# Patient Record
Sex: Female | Born: 1970 | Race: White | Hispanic: No | Marital: Married | Smoking: Former smoker
Health system: Southern US, Community
[De-identification: ages and names within clinical notes are randomized; demographics above are authoritative.]

## PROBLEM LIST (undated history)

## (undated) DIAGNOSIS — Z8669 Personal history of other diseases of the nervous system and sense organs: Secondary | ICD-10-CM

## (undated) DIAGNOSIS — I10 Essential (primary) hypertension: Secondary | ICD-10-CM

## (undated) DIAGNOSIS — K8020 Calculus of gallbladder without cholecystitis without obstruction: Secondary | ICD-10-CM

## (undated) DIAGNOSIS — R350 Frequency of micturition: Secondary | ICD-10-CM

## (undated) DIAGNOSIS — R200 Anesthesia of skin: Secondary | ICD-10-CM

## (undated) DIAGNOSIS — Z9889 Other specified postprocedural states: Secondary | ICD-10-CM

## (undated) DIAGNOSIS — T884XXA Failed or difficult intubation, initial encounter: Secondary | ICD-10-CM

## (undated) DIAGNOSIS — G4700 Insomnia, unspecified: Secondary | ICD-10-CM

## (undated) DIAGNOSIS — K5909 Other constipation: Secondary | ICD-10-CM

## (undated) DIAGNOSIS — K529 Noninfective gastroenteritis and colitis, unspecified: Secondary | ICD-10-CM

## (undated) DIAGNOSIS — K649 Unspecified hemorrhoids: Secondary | ICD-10-CM

## (undated) DIAGNOSIS — R29898 Other symptoms and signs involving the musculoskeletal system: Secondary | ICD-10-CM

## (undated) HISTORY — PX: ESOPHAGOGASTRODUODENOSCOPY: SHX1529

## (undated) HISTORY — PX: OTHER SURGICAL HISTORY: SHX169

## (undated) HISTORY — DX: Other symptoms and signs involving the musculoskeletal system: R29.898

## (undated) HISTORY — DX: Essential (primary) hypertension: I10

## (undated) HISTORY — DX: Anesthesia of skin: R20.0

## (undated) HISTORY — DX: Calculus of gallbladder without cholecystitis without obstruction: K80.20

## (undated) HISTORY — PX: COLONOSCOPY: SHX174

## (undated) HISTORY — PX: INCONTINENCE SURGERY: SHX676

---

## 2011-03-24 DIAGNOSIS — F419 Anxiety disorder, unspecified: Secondary | ICD-10-CM | POA: Insufficient documentation

## 2011-03-24 DIAGNOSIS — G4700 Insomnia, unspecified: Secondary | ICD-10-CM | POA: Insufficient documentation

## 2011-03-24 DIAGNOSIS — K589 Irritable bowel syndrome without diarrhea: Secondary | ICD-10-CM | POA: Insufficient documentation

## 2012-02-20 ENCOUNTER — Encounter (INDEPENDENT_AMBULATORY_CARE_PROVIDER_SITE_OTHER): Payer: Self-pay | Admitting: General Surgery

## 2012-02-20 ENCOUNTER — Ambulatory Visit (INDEPENDENT_AMBULATORY_CARE_PROVIDER_SITE_OTHER): Payer: 59 | Admitting: General Surgery

## 2012-02-20 VITALS — BP 136/92 | HR 77 | Temp 98.4°F | Resp 18 | Ht 65.5 in | Wt 196.2 lb

## 2012-02-20 DIAGNOSIS — K8020 Calculus of gallbladder without cholecystitis without obstruction: Secondary | ICD-10-CM

## 2012-02-20 NOTE — Patient Instructions (Signed)

## 2012-02-20 NOTE — Progress Notes (Signed)
Patient ID: Danielle Gould, female   DOB: 1970/10/25, 41 y.o.   MRN: [id number]  Chief Complaint  Patient presents with  . Abdominal Pain    new pt- eval GB    HPI Danielle Gould is a 41 y.o. female.   HPI This is a 41 year old female referred by Dr. Isabella Stalling for possible cholecystectomy. The patient states that she had an episode of severe epigastric pain about a month and a half ago. It was associated with vomiting and sweating. She initially thought it was food poisoning. She awoke in the middle of the night continuing to have right upper quadrant pain radiating toward her back and right shoulder. She went to work the next day but had to leave early due to the persistent pain. She went to the emergency room at [facility]. An ultrasound was performed which revealed gallstones. She was discharged to home from the emergency room. I do not have a copy of her blood work from that ER visit. She states since that time she has continued to have intermittent attacks. They generally occur twice a week. This sharp pain lasts for about 15 minutes but then she will have generalized discomfort and soreness for several hours. She denies any weight loss, fever, chills, acholic stools, recent NSAID use, melena or hematochezia.  Past Medical History  Diagnosis Date  . Hypertension   . Gallstones     Past Surgical History  Procedure Date  . Incontinence surgery     History reviewed. No pertinent family history.  Social History History  Substance Use Topics  . Smoking status: Current Everyday Smoker -- 0.5 packs/day  . Smokeless tobacco: Not on file  . Alcohol Use: Yes    No Known Allergies  Current Outpatient Prescriptions  Medication Sig Dispense Refill  . clonazePAM (KLONOPIN) 0.5 MG tablet       . dicyclomine (BENTYL) 20 MG tablet       . Metoprolol-Hydrochlorothiazide 50-12.5 MG TB24       . ondansetron (ZOFRAN-ODT) 4 MG disintegrating tablet       . oxyCODONE-acetaminophen  (PERCOCET) 5-325 MG per tablet       . temazepam (RESTORIL) 15 MG capsule       . zolpidem (AMBIEN) 10 MG tablet         Review of Systems Review of Systems  Constitutional: Negative for fever, activity change, appetite change and unexpected weight change.  HENT: Negative for nosebleeds, congestion and neck pain.   Eyes: Negative for photophobia and visual disturbance.  Respiratory: Negative for chest tightness and shortness of breath.        Denies DOE, PND; sleeps with 2 pillows  Cardiovascular: Negative for chest pain and leg swelling.  Gastrointestinal:       See hpi  Genitourinary: Negative for dysuria, hematuria and difficulty urinating.  Musculoskeletal:       L shoulder pain  Skin: Negative for color change.  Neurological: Positive for headaches. Negative for tremors, seizures, weakness, light-headedness and numbness.       No TIAs, amaurosis fugax  Hematological: Negative for adenopathy.  Psychiatric/Behavioral: Negative for suicidal ideas and behavioral problems.    Blood pressure 136/92, pulse 77, temperature 98.4 F (36.9 C), temperature source Temporal, resp. rate 18, height 5' 5.5" (1.664 m), weight 196 lb 3.2 oz (88.996 kg).  Physical Exam Physical Exam  Vitals reviewed. Constitutional: She is oriented to person, place, and time. She appears well-developed and well-nourished. No distress.  overweight  HENT:  Head: Normocephalic and atraumatic.  Right Ear: External ear normal.  Left Ear: External ear normal.  Eyes: Conjunctivae are normal. No scleral icterus.  Neck: Normal range of motion. Neck supple. No tracheal deviation present. No thyromegaly present.  Cardiovascular: Normal rate, regular rhythm, normal heart sounds and intact distal pulses.   Pulmonary/Chest: Effort normal and breath sounds normal. No respiratory distress. She has no wheezes.  Abdominal: Soft. Bowel sounds are normal. She exhibits no distension. There is no tenderness. There is no  guarding.  Musculoskeletal: Normal range of motion. She exhibits no edema and no tenderness.  Lymphadenopathy:    She has no cervical adenopathy.  Neurological: She is alert and oriented to person, place, and time. She exhibits normal muscle tone.  Skin: Skin is warm and dry. She is not diaphoretic.  Psychiatric: She has a normal mood and affect. Her behavior is normal. Judgment and thought content normal.    Data Reviewed Dr Tomma Lightning' note from 02/04/12 U/S from [facility] 01/09/12: The liver is homogeneous in echotexture without evidence of focal mass. No biliary ductal dilatation is noted. Gallstones are noted without gallbladder wall thickening. No sonographic Murphy's sign is noted. The common bile duct is within normal limits measuring 3.8 mm. Otherwise abdominal ultrasound normal  Assessment    Symptomatic cholelithiasis    Plan    I believe the majority of the patient's symptoms are due to gallbladder disease.   We discussed gallbladder disease. The patient was given Agricultural engineer. We discussed non-operative and operative management. We discussed the signs & symptoms of acute cholecystitis.  I discussed laparoscopic cholecystectomy with IOC in detail.  The patient was given educational material as well as diagrams detailing the procedure.  We discussed the risks and benefits of a laparoscopic cholecystectomy including, but not limited to bleeding, infection, injury to surrounding structures such as the intestine or liver, bile leak, retained gallstones, need to convert to an open procedure, prolonged diarrhea, blood clots such as  DVT, common bile duct injury, anesthesia risks, and possible need for additional procedures.  We discussed the typical post-operative recovery course. I explained that the likelihood of improvement of their symptoms is good.  The patient was also strongly encouraged to get a routine screening mammogram as part of her preventative health  maintenance especially given her family history.  The patient will be scheduled for a LAPAROSCOPIC CHOLECYSTECTOMY WITH IOC  Mary Sella. Andrey Campanile, MD, FACS General, Bariatric, & Minimally Invasive Surgery [facility], [place]        [facility] M 02/20/2012, 6:25 PM

## 2012-03-22 ENCOUNTER — Encounter (HOSPITAL_COMMUNITY): Payer: Self-pay | Admitting: Respiratory Therapy

## 2012-03-25 ENCOUNTER — Other Ambulatory Visit: Payer: Self-pay

## 2012-03-25 ENCOUNTER — Encounter (HOSPITAL_COMMUNITY): Payer: Self-pay

## 2012-03-25 ENCOUNTER — Encounter (HOSPITAL_COMMUNITY)
Admission: RE | Admit: 2012-03-25 | Discharge: 2012-03-25 | Disposition: A | Discharge status: Home / Self Care | Payer: 59 | Source: Ambulatory Visit | Attending: General Surgery | Admitting: General Surgery

## 2012-03-25 HISTORY — DX: Other constipation: K59.09

## 2012-03-25 HISTORY — DX: Unspecified hemorrhoids: K64.9

## 2012-03-25 HISTORY — DX: Insomnia, unspecified: G47.00

## 2012-03-25 HISTORY — DX: Other specified postprocedural states: Z98.890

## 2012-03-25 HISTORY — DX: Frequency of micturition: R35.0

## 2012-03-25 HISTORY — DX: Personal history of other diseases of the nervous system and sense organs: Z86.69

## 2012-03-25 HISTORY — DX: Noninfective gastroenteritis and colitis, unspecified: K52.9

## 2012-03-25 LAB — HEPATIC FUNCTION PANEL
Albumin: 3.6 g/dL (ref 3.5–5.2)
Alkaline Phosphatase: 62 U/L (ref 39–117)
Total Protein: 7.4 g/dL (ref 6.0–8.3)

## 2012-03-25 LAB — CBC WITH DIFFERENTIAL/PLATELET
Basophils Absolute: 0.1 10*3/uL (ref 0.0–0.1)
Basophils Relative: 1 % (ref 0–1)
Eosinophils Absolute: 0.2 10*3/uL (ref 0.0–0.7)
Eosinophils Relative: 3 % (ref 0–5)
HCT: 42.9 % (ref 36.0–46.0)
MCH: 30.2 pg (ref 26.0–34.0)
MCHC: 34.5 g/dL (ref 30.0–36.0)
MCV: 87.6 fL (ref 78.0–100.0)
Monocytes Absolute: 0.4 10*3/uL (ref 0.1–1.0)
Monocytes Relative: 6 % (ref 3–12)
Neutro Abs: 4 10*3/uL (ref 1.7–7.7)
RDW: 13.2 % (ref 11.5–15.5)

## 2012-03-25 LAB — BASIC METABOLIC PANEL
BUN: 11 mg/dL (ref 6–23)
Chloride: 101 mEq/L (ref 96–112)
Creatinine, Ser: 0.83 mg/dL (ref 0.50–1.10)
GFR calc Af Amer: >90 mL/min (ref >90)
Glucose, Bld: 92 mg/dL (ref 70–99)

## 2012-03-25 MED ORDER — CHLORHEXIDINE GLUCONATE 4 % EX LIQD: 1.0000 "application " | Freq: Once | CUTANEOUS | Status: DC

## 2012-03-25 NOTE — Progress Notes (Signed)
Pt doesn't have a cardiologist  Stress test done 9+yrs ago(no issues but only done b/c of complete physical) Denies echo or heart cath  Medical MD is Dr.Kates on [place]  Denies recent ekg or cxr

## 2012-03-25 NOTE — Pre-Procedure Instructions (Signed)
20 Danielle Gould  03/25/2012   Your procedure is scheduled on:  Thurs, Aug 8 @ 7:30 AM  Report to [facility] at 5:30 AM.  Call this number if you have problems the morning of surgery: [phone]   Remember:   Do not eat food:After Midnight.    Take these medicines the morning of surgery with A SIP OF WATER: Metoprolol(Toprol) and Pain Pill(if needed)   Do not wear jewelry, make-up or nail polish.  Do not wear lotions, powders, or perfumes.   Do not shave 48 hours prior to surgery.  Do not bring valuables to the hospital.  Contacts, dentures or bridgework may not be worn into surgery.  Leave suitcase in the car. After surgery it may be brought to your room.  For patients admitted to the hospital, checkout time is 11:00 AM the day of discharge.   Patients discharged the day of surgery will not be allowed to drive home.    Special Instructions: CHG Shower Use Special Wash: 1/2 bottle night before surgery and 1/2 bottle morning of surgery.   Please read over the following fact sheets that you were given: Pain Booklet, Coughing and Deep Breathing, MRSA Information and Surgical Site Infection Prevention

## 2012-03-26 NOTE — Consult Note (Addendum)
Anesthesia Chart Review:  Patient is a 41 year old female scheduled for laparoscopic cholecystectomy on 04/01/12 by Dr. Andrey Campanile.  PCP is Dr. Tomma Lightning [id number]) on [place].  History includes smoking, HTN, obesity with BMI 32, uterine prolopse, transurethral destruction of bladder lesion, insomnia.  Chest x-ray on 03/25/2012 showed borderline cardiomegaly, but no active disease.  Labs acceptable.  EKG on 03/25/12 showed NSR with sinus arrhythmia, possible lateral infarct (age undetermined).  She has what appears to be small, non-diagnositic Q waves in lateral leads and an negative QRS in aVL.  Will see if we can get a prior EKG from her PCP office.  She reported a prior stress test but it was done > 9 years ago in [place] that was reported as "normal".  Shonna Chock, PA-C  Addendum: 03/29/12 1415 I called Dr. Isabella Stalling office.  There are no comparison EKGs or prior cardiac studies.  I called and spoke with Ms. Marchia Bond.  She denies chest pain, SOB, edema.  She has not had any recent cardiac studies.  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  She has had no known complications with Anesthesia.  Reviewed EKG with Anesthesiologist Dr. Gypsy Balsam.  Plan to proceed if remains asymptomatic from a CV standpoint.

## 2012-03-30 ENCOUNTER — Telehealth (INDEPENDENT_AMBULATORY_CARE_PROVIDER_SITE_OTHER): Payer: Self-pay | Admitting: General Surgery

## 2012-03-30 NOTE — Telephone Encounter (Signed)
Message copied by Liliana Cline on Tue Mar 30, 2012 11:04 AM ------      Message from: Andrey Campanile, ERIC M      Created: Sun Mar 28, 2012 12:36 AM       pls have preop have anesthesia attending review EKG

## 2012-03-30 NOTE — Telephone Encounter (Signed)
Spoke with Revonda Standard, PA at [facility] who reviewed EKG with Dr Gypsy Balsam in anesthesia who stated if patient was asymptomatic okay to proceed with surgery. They spoke with patient and she is asymptomatic.

## 2012-03-31 MED ORDER — DEXTROSE 5 % IV SOLN
2.0000 g | INTRAVENOUS | Status: AC
  Administered 2012-04-01: 2 g via INTRAVENOUS
  Filled 2012-03-31: qty 2

## 2012-04-01 ENCOUNTER — Encounter (HOSPITAL_COMMUNITY)
Admission: RE | Disposition: A | Discharge status: Home / Self Care | Payer: Self-pay | Source: Ambulatory Visit | Attending: General Surgery

## 2012-04-01 ENCOUNTER — Ambulatory Visit (HOSPITAL_COMMUNITY): Payer: 59

## 2012-04-01 ENCOUNTER — Encounter (HOSPITAL_COMMUNITY): Payer: Self-pay | Admitting: Surgery

## 2012-04-01 ENCOUNTER — Ambulatory Visit (HOSPITAL_COMMUNITY)
Admission: RE | Admit: 2012-04-01 | Discharge: 2012-04-01 | Disposition: A | Discharge status: Home / Self Care | Payer: 59 | Source: Ambulatory Visit | Attending: General Surgery | Admitting: General Surgery

## 2012-04-01 ENCOUNTER — Ambulatory Visit (HOSPITAL_COMMUNITY): Payer: 59 | Admitting: Vascular Surgery

## 2012-04-01 ENCOUNTER — Encounter (HOSPITAL_COMMUNITY): Payer: Self-pay | Admitting: Vascular Surgery

## 2012-04-01 DIAGNOSIS — Z01818 Encounter for other preprocedural examination: Secondary | ICD-10-CM | POA: Insufficient documentation

## 2012-04-01 DIAGNOSIS — Z01812 Encounter for preprocedural laboratory examination: Secondary | ICD-10-CM | POA: Insufficient documentation

## 2012-04-01 DIAGNOSIS — K8010 Calculus of gallbladder with chronic cholecystitis without obstruction: Secondary | ICD-10-CM | POA: Insufficient documentation

## 2012-04-01 DIAGNOSIS — K8020 Calculus of gallbladder without cholecystitis without obstruction: Secondary | ICD-10-CM

## 2012-04-01 DIAGNOSIS — Z01810 Encounter for preprocedural cardiovascular examination: Secondary | ICD-10-CM | POA: Insufficient documentation

## 2012-04-01 DIAGNOSIS — K219 Gastro-esophageal reflux disease without esophagitis: Secondary | ICD-10-CM | POA: Insufficient documentation

## 2012-04-01 DIAGNOSIS — I10 Essential (primary) hypertension: Secondary | ICD-10-CM | POA: Insufficient documentation

## 2012-04-01 HISTORY — PX: CHOLECYSTECTOMY: SHX55

## 2012-04-01 SURGERY — LAPAROSCOPIC CHOLECYSTECTOMY WITH INTRAOPERATIVE CHOLANGIOGRAM
Anesthesia: General | Site: Abdomen | Wound class: Clean Contaminated

## 2012-04-01 MED ORDER — OXYCODONE-ACETAMINOPHEN 5-325 MG PO TABS: 1.0000 | ORAL_TABLET | ORAL | Status: AC | PRN

## 2012-04-01 MED ORDER — HYDROMORPHONE HCL PF 1 MG/ML IJ SOLN
0.2500 mg | INTRAMUSCULAR | Status: DC | PRN
  Administered 2012-04-01 (×2): 0.5 mg via INTRAVENOUS

## 2012-04-01 MED ORDER — SODIUM CHLORIDE 0.9 % IR SOLN
Status: DC | PRN
  Administered 2012-04-01: 1

## 2012-04-01 MED ORDER — ROCURONIUM BROMIDE 100 MG/10ML IV SOLN
INTRAVENOUS | Status: DC | PRN
  Administered 2012-04-01: 50 mg via INTRAVENOUS

## 2012-04-01 MED ORDER — 0.9 % SODIUM CHLORIDE (POUR BTL) OPTIME
TOPICAL | Status: DC | PRN
  Administered 2012-04-01: 1000 mL

## 2012-04-01 MED ORDER — ONDANSETRON HCL 4 MG/2ML IJ SOLN: 4.0000 mg | Freq: Once | INTRAMUSCULAR | Status: DC | PRN

## 2012-04-01 MED ORDER — ONDANSETRON HCL 4 MG/2ML IJ SOLN
INTRAMUSCULAR | Status: DC | PRN
  Administered 2012-04-01: 4 mg via INTRAVENOUS

## 2012-04-01 MED ORDER — GLYCOPYRROLATE 0.2 MG/ML IJ SOLN
INTRAMUSCULAR | Status: DC | PRN
  Administered 2012-04-01: 0.2 mg via INTRAVENOUS
  Administered 2012-04-01: 0.4 mg via INTRAVENOUS

## 2012-04-01 MED ORDER — BUPIVACAINE-EPINEPHRINE 0.25% -1:200000 IJ SOLN
INTRAMUSCULAR | Status: DC | PRN
  Administered 2012-04-01: 19 mL

## 2012-04-01 MED ORDER — HYDROMORPHONE HCL PF 1 MG/ML IJ SOLN
INTRAMUSCULAR | Status: AC
  Filled 2012-04-01: qty 1

## 2012-04-01 MED ORDER — DEXAMETHASONE SODIUM PHOSPHATE 4 MG/ML IJ SOLN
INTRAMUSCULAR | Status: DC | PRN
  Administered 2012-04-01: 8 mg via INTRAVENOUS

## 2012-04-01 MED ORDER — BUPIVACAINE-EPINEPHRINE PF 0.25-1:200000 % IJ SOLN
INTRAMUSCULAR | Status: AC
  Filled 2012-04-01: qty 30

## 2012-04-01 MED ORDER — SODIUM CHLORIDE 0.9 % IV SOLN
INTRAVENOUS | Status: DC | PRN
  Administered 2012-04-01: 08:00:00

## 2012-04-01 MED ORDER — MIDAZOLAM HCL 5 MG/5ML IJ SOLN
INTRAMUSCULAR | Status: DC | PRN
  Administered 2012-04-01: 2 mg via INTRAVENOUS

## 2012-04-01 MED ORDER — FENTANYL CITRATE 0.05 MG/ML IJ SOLN
INTRAMUSCULAR | Status: DC | PRN
  Administered 2012-04-01: 50 ug via INTRAVENOUS
  Administered 2012-04-01: 150 ug via INTRAVENOUS
  Administered 2012-04-01: 50 ug via INTRAVENOUS

## 2012-04-01 MED ORDER — NEOSTIGMINE METHYLSULFATE 1 MG/ML IJ SOLN
INTRAMUSCULAR | Status: DC | PRN
  Administered 2012-04-01: 3 mg via INTRAVENOUS

## 2012-04-01 MED ORDER — LACTATED RINGERS IV SOLN
INTRAVENOUS | Status: DC | PRN
  Administered 2012-04-01 (×2): via INTRAVENOUS

## 2012-04-01 MED ORDER — PROPOFOL 10 MG/ML IV EMUL
INTRAVENOUS | Status: DC | PRN
  Administered 2012-04-01: 120 mg via INTRAVENOUS

## 2012-04-01 SURGICAL SUPPLY — 45 items
APPLIER CLIP 5 13 M/L LIGAMAX5 (MISCELLANEOUS) ×2
BANDAGE ADHESIVE 1X3 (GAUZE/BANDAGES/DRESSINGS) IMPLANT
BENZOIN TINCTURE PRP APPL 2/3 (GAUZE/BANDAGES/DRESSINGS) IMPLANT
BLADE SURG ROTATE 9660 (MISCELLANEOUS) IMPLANT
CANISTER SUCTION 2500CC (MISCELLANEOUS) ×2 IMPLANT
CHLORAPREP W/TINT 26ML (MISCELLANEOUS) ×2 IMPLANT
CLIP APPLIE 5 13 M/L LIGAMAX5 (MISCELLANEOUS) ×1 IMPLANT
CLOTH BEACON ORANGE TIMEOUT ST (SAFETY) ×2 IMPLANT
COVER MAYO STAND STRL (DRAPES) ×2 IMPLANT
COVER SURGICAL LIGHT HANDLE (MISCELLANEOUS) ×2 IMPLANT
DECANTER SPIKE VIAL GLASS SM (MISCELLANEOUS) IMPLANT
DERMABOND ADVANCED (GAUZE/BANDAGES/DRESSINGS) ×1
DERMABOND ADVANCED .7 DNX12 (GAUZE/BANDAGES/DRESSINGS) ×1 IMPLANT
DRAPE C-ARM 42X72 X-RAY (DRAPES) ×2 IMPLANT
DRAPE UTILITY 15X26 W/TAPE STR (DRAPE) ×4 IMPLANT
DRSG TEGADERM 4X4.75 (GAUZE/BANDAGES/DRESSINGS) ×2 IMPLANT
ELECT REM PT RETURN 9FT ADLT (ELECTROSURGICAL) ×2
ELECTRODE REM PT RTRN 9FT ADLT (ELECTROSURGICAL) ×1 IMPLANT
GAUZE SPONGE 2X2 8PLY STRL LF (GAUZE/BANDAGES/DRESSINGS) ×1 IMPLANT
GLOVE BIO SURGEON STRL SZ 6.5 (GLOVE) ×2 IMPLANT
GLOVE BIOGEL M STRL SZ7.5 (GLOVE) ×2 IMPLANT
GLOVE BIOGEL PI IND STRL 7.0 (GLOVE) ×2 IMPLANT
GLOVE BIOGEL PI IND STRL 8 (GLOVE) ×1 IMPLANT
GLOVE BIOGEL PI INDICATOR 7.0 (GLOVE) ×2
GLOVE BIOGEL PI INDICATOR 8 (GLOVE) ×1
GLOVE SURG SS PI 7.0 STRL IVOR (GLOVE) ×2 IMPLANT
GOWN STRL NON-REIN LRG LVL3 (GOWN DISPOSABLE) ×4 IMPLANT
GOWN STRL REIN XL XLG (GOWN DISPOSABLE) ×2 IMPLANT
KIT BASIN OR (CUSTOM PROCEDURE TRAY) ×2 IMPLANT
KIT ROOM TURNOVER OR (KITS) ×2 IMPLANT
NS IRRIG 1000ML POUR BTL (IV SOLUTION) ×2 IMPLANT
PAD ARMBOARD 7.5X6 YLW CONV (MISCELLANEOUS) ×2 IMPLANT
POUCH SPECIMEN RETRIEVAL 10MM (ENDOMECHANICALS) ×2 IMPLANT
SCISSORS LAP 5X35 DISP (ENDOMECHANICALS) IMPLANT
SET CHOLANGIOGRAPH 5 50 .035 (SET/KITS/TRAYS/PACK) ×2 IMPLANT
SET IRRIG TUBING LAPAROSCOPIC (IRRIGATION / IRRIGATOR) ×2 IMPLANT
SLEEVE ENDOPATH XCEL 5M (ENDOMECHANICALS) ×4 IMPLANT
SPECIMEN JAR SMALL (MISCELLANEOUS) ×2 IMPLANT
SPONGE GAUZE 2X2 STER 10/PKG (GAUZE/BANDAGES/DRESSINGS) ×1
SUT MNCRL AB 4-0 PS2 18 (SUTURE) ×2 IMPLANT
TOWEL OR 17X24 6PK STRL BLUE (TOWEL DISPOSABLE) ×2 IMPLANT
TOWEL OR 17X26 10 PK STRL BLUE (TOWEL DISPOSABLE) ×2 IMPLANT
TRAY LAPAROSCOPIC (CUSTOM PROCEDURE TRAY) ×2 IMPLANT
TROCAR XCEL BLUNT TIP 100MML (ENDOMECHANICALS) ×2 IMPLANT
TROCAR XCEL NON-BLD 5MMX100MML (ENDOMECHANICALS) ×2 IMPLANT

## 2012-04-01 NOTE — Anesthesia Procedure Notes (Signed)
Procedure Name: Intubation Date/Time: 04/01/2012 7:37 AM Performed by: Jerilee Hoh Pre-anesthesia Checklist: Patient identified, Emergency Drugs available, Suction available and Patient being monitored Patient Re-evaluated:Patient Re-evaluated prior to inductionOxygen Delivery Method: Circle system utilized Preoxygenation: Pre-oxygenation with 100% oxygen Intubation Type: IV induction Ventilation: Mask ventilation without difficulty Laryngoscope Size: Mac and 3 Grade View: Grade II Tube type: Oral Tube size: 7.5 mm Number of attempts: 1 Airway Equipment and Method: Stylet Placement Confirmation: ETT inserted through vocal cords under direct vision,  positive ETCO2 and breath sounds checked- equal and bilateral Secured at: 21 cm Tube secured with: Tape Dental Injury: Teeth and Oropharynx as per pre-operative assessment

## 2012-04-01 NOTE — Anesthesia Preprocedure Evaluation (Addendum)
Anesthesia Evaluation  Patient identified by MRN, date of birth, ID band Patient awake    Reviewed: Allergy & Precautions, H&P , NPO status , Patient's Chart, lab work & pertinent test results, reviewed documented beta blocker date and time   Airway Mallampati: II TM Distance: >3 FB Neck ROM: Full    Dental  (+) Teeth Intact, Caps and Dental Advisory Given   Pulmonary          Cardiovascular hypertension, Pt. on home beta blockers     Neuro/Psych    GI/Hepatic GERD-  Medicated and Controlled,  Endo/Other    Renal/GU      Musculoskeletal   Abdominal   Peds  Hematology   Anesthesia Other Findings   Reproductive/Obstetrics                         Anesthesia Physical Anesthesia Plan  ASA: II  Anesthesia Plan: General   Post-op Pain Management:    Induction: Intravenous  Airway Management Planned: Oral ETT  Additional Equipment:   Intra-op Plan:   Post-operative Plan: Extubation in OR  Informed Consent: I have reviewed the patients History and Physical, chart, labs and discussed the procedure including the risks, benefits and alternatives for the proposed anesthesia with the patient or authorized representative who has indicated his/her understanding and acceptance.   Dental advisory given  Plan Discussed with: Surgeon and CRNA  Anesthesia Plan Comments:        Anesthesia Quick Evaluation

## 2012-04-01 NOTE — Transfer of Care (Signed)
Immediate Anesthesia Transfer of Care Note  Patient: Danielle Gould  Procedure(s) Performed: Procedure(s) (LRB): LAPAROSCOPIC CHOLECYSTECTOMY WITH INTRAOPERATIVE CHOLANGIOGRAM (N/A)  Patient Location: PACU  Anesthesia Type: General  Level of Consciousness: awake, alert , oriented and patient cooperative  Airway & Oxygen Therapy: Patient Spontanous Breathing and Patient connected to nasal cannula oxygen  Post-op Assessment: Report given to PACU RN, Post -op Vital signs reviewed and stable and Patient moving all extremities  Post vital signs: Reviewed and stable  Complications: No apparent anesthesia complications

## 2012-04-01 NOTE — Anesthesia Postprocedure Evaluation (Signed)
Anesthesia Post Note  Patient: Audiological scientist  Procedure(s) Performed: Procedure(s) (LRB): LAPAROSCOPIC CHOLECYSTECTOMY WITH INTRAOPERATIVE CHOLANGIOGRAM (N/A)  Anesthesia type: general  Patient location: PACU  Post pain: Pain level controlled  Post assessment: Patient's Cardiovascular Status Stable  Last Vitals:  Filed Vitals:   04/01/12 0945  BP: 106/51  Pulse: 72  Temp:   Resp: 19    Post vital signs: Reviewed and stable  Level of consciousness: sedated  Complications: No apparent anesthesia complications

## 2012-04-01 NOTE — Op Note (Signed)
Laparoscopic Cholecystectomy with IOC Procedure Note  Indications: This patient presents with symptomatic gallbladder disease and will undergo laparoscopic cholecystectomy.  Pre-operative Diagnosis: Calculus of gallbladder with other cholecystitis, without mention of obstruction  Post-operative Diagnosis: Same  Surgeon: Atilano Ina   Assistants: none  Anesthesia: General endotracheal anesthesia  ASA Class: 2  Procedure Details  The patient was seen again in the Holding Room. The risks, benefits, complications, treatment options, and expected outcomes were discussed with the patient. The possibilities of reaction to medication, pulmonary aspiration, perforation of viscus, bleeding, recurrent infection, finding a normal gallbladder, the need for additional procedures, failure to diagnose a condition, the possible need to convert to an open procedure, and creating a complication requiring transfusion or operation were discussed with the patient. The likelihood of improving the patient's symptoms with return to their baseline status is good.  The patient and/or family concurred with the proposed plan, giving informed consent. The site of surgery properly noted. The patient was taken to Operating Room, identified as Danielle Gould and the procedure verified as Laparoscopic Cholecystectomy with Intraoperative Cholangiogram. A Time Out was held and the above information confirmed.  Prior to the induction of general anesthesia, antibiotic prophylaxis was administered. General endotracheal anesthesia was then administered and tolerated well. After the induction, the abdomen was prepped with Chloraprep and draped in the sterile fashion. The patient was positioned in the supine position.  Local anesthetic agent was injected into the skin near the umbilicus and an incision made. We dissected down to the abdominal fascia with blunt dissection.  The fascia was incised vertically and we entered the peritoneal  cavity bluntly.  A pursestring suture of 0-Vicryl was placed around the fascial opening.  The Hasson cannula was inserted and secured with the stay suture.  Pneumoperitoneum was then created with CO2 and tolerated well without any adverse changes in the patient's vital signs. An 5-mm port was placed in the subxiphoid position.  Two 5-mm ports were placed in the right upper quadrant. All skin incisions were infiltrated with a local anesthetic agent before making the incision and placing the trocars.   We positioned the patient in reverse Trendelenburg, tilted slightly to the patient's left.  The gallbladder was identified, the fundus grasped and retracted cephalad. Adhesions were lysed bluntly and with the electrocautery where indicated, taking care not to injure any adjacent organs or viscus. The infundibulum was grasped and retracted laterally, exposing the peritoneum overlying the triangle of Calot. This was then divided and exposed in a blunt fashion. A critical view of the cystic duct and cystic artery was obtained.  The cystic duct was clearly identified and bluntly dissected circumferentially. The cystic duct was ligated with a clip distally.   An incision was made in the cystic duct and the [facility] cholangiogram catheter introduced. The catheter was secured using a clip. A cholangiogram was then obtained which showed good visualization of the distal and proximal biliary tree with no sign of filling defects or obstruction.  Contrast flowed easily into the duodenum. The catheter was then removed.   The cystic duct was then ligated with 3 clips and divided. The cystic artery was identified, dissected free, ligated with 2 clips and divided as well. A small anterior branch of the cystic artery was divided with 1 clip and electrocautery distally  The gallbladder was dissected from the liver bed in retrograde fashion with the electrocautery. The gallbladder was removed and placed in an Endocatch sac.  The  gallbladder and Endocatch sac were  then removed through the umbilical port site. The liver bed was irrigated and inspected. Hemostasis was achieved with the electrocautery. Copious irrigation was utilized and was repeatedly aspirated until clear.  The pursestring suture was used to close the umbilical fascia.    We again inspected the right upper quadrant for hemostasis.  The umbilical closure was inspected and there was no air leak and nothing trapped within the closure. Pneumoperitoneum was released as we removed the trocars.  4-0 Monocryl was used to close the skin.   Dermabond was applied. The patient was then extubated and brought to the recovery room in stable condition. Instrument, sponge, and needle counts were correct at closure and at the conclusion of the case.   Findings: Chronic Cholecystitis with Cholelithiasis  Estimated Blood Loss: Minimal         Drains: n/a         Specimens: Gallbladder           Complications: None; patient tolerated the procedure well.         Disposition: PACU - hemodynamically stable.         Condition: stable  Danielle Gould. Andrey Campanile, MD, FACS General, Bariatric, & Minimally Invasive Surgery [facility], [place]

## 2012-04-01 NOTE — Preoperative (Signed)
Beta Blockers   Reason not to administer Beta Blockers:Not Applicable 

## 2012-04-01 NOTE — H&P (Signed)
Danielle Gould is an 41 y.o. female.   Chief Complaint: here for surgery HPI: This is a 41 year old female referred by Dr. Isabella Stalling for possible cholecystectomy. The patient states that she had an episode of severe epigastric pain about a month and a half ago. It was associated with vomiting and sweating. She initially thought it was food poisoning. She awoke in the middle of the night continuing to have right upper quadrant pain radiating toward her back and right shoulder. She went to work the next day but had to leave early due to the persistent pain. She went to the emergency room at [facility]. An ultrasound was performed which revealed gallstones. She was discharged to home from the emergency room. I do not have a copy of her blood work from that ER visit. She states since that time she has continued to have intermittent attacks. They generally occur twice a week. This sharp pain lasts for about 15 minutes but then she will have generalized discomfort and soreness for several hours. She denies any weight loss, fever, chills, acholic stools, recent NSAID use, melena or hematochezia.   Past Medical History  Diagnosis Date  . Gallstones   . H/O transurethral destruction of bladder lesion     [facility]  . Hypertension     takes Metoprolol daily  . History of migraine     last one a wk ago and pt states menstrual related  . Chronic constipation   . Chronic diarrhea   . Hemorrhoids   . Urinary frequency   . Insomnia     takes Restoril prn     Past Surgical History  Procedure Date  . Incontinence surgery 7-80yrs ago  . Uterus collapsed 7-80yrs ago  . Colonoscopy   . Esophagogastroduodenoscopy     History reviewed. No pertinent family history. Social History:  reports that she has been smoking.  She does not have any smokeless tobacco history on file. She reports that she drinks alcohol. She reports that she does not use illicit  drugs.  Allergies: No Known Allergies  Medications Prior to Admission  Medication Sig Dispense Refill  . B Complex-C (B-COMPLEX WITH VITAMIN C) tablet Take 1 tablet by mouth daily.      . metoprolol succinate (TOPROL-XL) 50 MG 24 hr tablet Take 50 mg by mouth daily. Take with or immediately following a meal.      . oxyCODONE-acetaminophen (PERCOCET) 5-325 MG per tablet Take 1 tablet by mouth every 8 (eight) hours as needed. For pain      . temazepam (RESTORIL) 15 MG capsule Take 15 mg by mouth at bedtime as needed. For sleep        No results found for this or any previous visit (from the past 48 hour(s)). No results found.  Review of Systems  Constitutional: Negative for fever and chills.  Respiratory: Negative for shortness of breath.   Cardiovascular: Negative for chest pain, palpitations and leg swelling.  Gastrointestinal: Positive for heartburn and nausea.  Neurological: Negative for seizures and loss of consciousness.  All other systems reviewed and are negative.    Blood pressure 148/76, pulse 69, temperature 97.9 F (36.6 C), temperature source Oral, resp. rate 18, last menstrual period 03/15/2012, SpO2 97.00%. Physical Exam  Vitals reviewed. Constitutional: She is oriented to person, place, and time. She appears well-developed and well-nourished. No distress.  HENT:  Head: Normocephalic and atraumatic.  Eyes: Conjunctivae are normal. No scleral icterus.  Neck: Normal range of motion. Neck supple. No tracheal deviation present.  Cardiovascular: Normal rate and normal heart sounds.   Respiratory: Effort normal. No respiratory distress.  GI: Soft. She exhibits no distension. There is no tenderness.  Musculoskeletal: She exhibits no edema and no tenderness.  Neurological: She is alert and oriented to person, place, and time. No cranial nerve deficit. She exhibits normal muscle tone.  Skin: Skin is warm and dry. She is not diaphoretic.  Psychiatric: She has a normal mood  and affect. Her behavior is normal. Judgment and thought content normal.     Assessment/Plan Symptomatic cholelithiasis  To OR for lap chole  All questions asked and answered  Mary Sella. Andrey Campanile, MD, FACS General, Bariatric, & Minimally Invasive Surgery [facility], [place]   [facility] M 04/01/2012, 7:09 AM

## 2012-04-02 ENCOUNTER — Telehealth (INDEPENDENT_AMBULATORY_CARE_PROVIDER_SITE_OTHER): Payer: Self-pay

## 2012-04-02 ENCOUNTER — Encounter (HOSPITAL_COMMUNITY): Payer: Self-pay | Admitting: General Surgery

## 2012-04-02 NOTE — Telephone Encounter (Signed)
Sounds reasonable. Arm soreness should get better with time. If develops new symptoms around belly button or if worsens - pls call back

## 2012-04-02 NOTE — Telephone Encounter (Signed)
I called the patient to check on her postop and give the postop appointment.  She is concerned about her belly button and it kept her up last night.  She says it is sore, red and swollen.  I told her this is usually the largest incision and where the gallbladder comes out so it can be most sore.  I told her to keep icing it and alternate Advil or Ibuprofen in between her pain medicine.  She will call if it gets worse  She asked if her arm was over her head during surgery because it is sore.  I told her they may have been strapped down to the side but not over her head.    I told her I will let Dr Andrey Campanile know and we will call if there is any further advice.

## 2012-04-02 NOTE — Progress Notes (Signed)
Patient referred to physicians office for pain in her umbilical area that she reports feels like is tearing type pain

## 2012-04-05 ENCOUNTER — Telehealth (INDEPENDENT_AMBULATORY_CARE_PROVIDER_SITE_OTHER): Payer: Self-pay | Admitting: General Surgery

## 2012-04-05 NOTE — Telephone Encounter (Signed)
Pt calling to request more pain meds; understands it will be Vicodin this time.  Hydrocodone 5/325 mg, # 30, 1-2 po Q 4-6 H prn pain, no refill called to [facility]:  [phone].

## 2012-04-21 ENCOUNTER — Ambulatory Visit (INDEPENDENT_AMBULATORY_CARE_PROVIDER_SITE_OTHER): Payer: 59 | Admitting: General Surgery

## 2012-04-21 ENCOUNTER — Encounter (INDEPENDENT_AMBULATORY_CARE_PROVIDER_SITE_OTHER): Payer: Self-pay | Admitting: General Surgery

## 2012-04-21 VITALS — BP 118/68 | HR 80 | Temp 98.4°F | Resp 16 | Ht 65.0 in | Wt 193.0 lb

## 2012-04-21 DIAGNOSIS — Z09 Encounter for follow-up examination after completed treatment for conditions other than malignant neoplasm: Secondary | ICD-10-CM

## 2012-04-21 MED ORDER — TRAMADOL HCL 50 MG PO TABS: 50.0000 mg | ORAL_TABLET | Freq: Four times a day (QID) | ORAL | Status: DC | PRN

## 2012-04-21 NOTE — Patient Instructions (Signed)
Drink 6-8 glasses of water per day. Eat a high fiber diet. Try taking a stool softner like colace for 2 weeks

## 2012-04-21 NOTE — Progress Notes (Signed)
Subjective:     Patient ID: Danielle Gould, female   DOB: 03-04-1971, 41 y.o.   MRN: [id number]  HPI 41 year old female comes in for followup after undergoing laparoscopic cholecystectomy with interoperative cholangiogram. This was done on August 8. She states that she is still sore around her umbilicus. She says that her energy level is not back to normal yet. She denies any vomiting. She denies any fevers or chills. She states that she still little bit irregular with her bowel movements. She is having a bowel movement about every second to third day.  Review of Systems     Objective:   Physical Exam BP 118/68  Pulse 80  Temp 98.4 F (36.9 C) (Temporal)  Resp 16  Ht 5\' 5"  (1.651 m)  Wt 193 lb (87.544 kg)  BMI 32.12 kg/m2  LMP 03/22/2012  Gen: alert, NAD, non-toxic appearing Pupils: equal, no scleral icterus Pulm: Lungs clear to auscultation, symmetric chest rise CV: regular rate and rhythm Abd: soft, mild tenderness around umbilicus, nondistended. Well-healed trocar sites. No cellulitis. No incisional hernia Ext: no edema, no calf tenderness Skin: no rash, no jaundice     Assessment:     S/p Lap chole with IOC    Plan:     We discussed her pathology report which revealed chronic cholecystitis and cholelithiasis. She was given a copy of her pathology report. She was given a note to return to work. She was also given a prescription for Ultram. She was encouraged to high fiber diet and drink plenty of water to help her get regular again. Followup when necessary  Mary Sella. Andrey Campanile, MD, FACS General, Bariatric, & Minimally Invasive Surgery [facility], [place]

## 2012-09-20 ENCOUNTER — Other Ambulatory Visit: Payer: Self-pay | Admitting: Obstetrics and Gynecology

## 2012-09-20 ENCOUNTER — Other Ambulatory Visit (HOSPITAL_COMMUNITY)
Admission: RE | Admit: 2012-09-20 | Discharge: 2012-09-20 | Disposition: A | Discharge status: Home / Self Care | Payer: 59 | Source: Ambulatory Visit | Attending: Obstetrics and Gynecology | Admitting: Obstetrics and Gynecology

## 2012-09-20 DIAGNOSIS — Z1151 Encounter for screening for human papillomavirus (HPV): Secondary | ICD-10-CM | POA: Insufficient documentation

## 2012-09-20 DIAGNOSIS — Z01419 Encounter for gynecological examination (general) (routine) without abnormal findings: Secondary | ICD-10-CM | POA: Insufficient documentation

## 2012-09-20 DIAGNOSIS — R87810 Cervical high risk human papillomavirus (HPV) DNA test positive: Secondary | ICD-10-CM | POA: Insufficient documentation

## 2012-11-02 ENCOUNTER — Other Ambulatory Visit: Payer: Self-pay | Admitting: Obstetrics and Gynecology

## 2012-11-23 ENCOUNTER — Other Ambulatory Visit: Payer: Self-pay | Admitting: Obstetrics and Gynecology

## 2012-12-07 ENCOUNTER — Encounter (HOSPITAL_COMMUNITY)
Admission: RE | Disposition: A | Discharge status: Home / Self Care | Payer: Self-pay | Source: Ambulatory Visit | Attending: Obstetrics and Gynecology

## 2012-12-07 ENCOUNTER — Encounter (HOSPITAL_COMMUNITY): Payer: Self-pay | Admitting: *Deleted

## 2012-12-07 ENCOUNTER — Ambulatory Visit (HOSPITAL_COMMUNITY): Payer: 59 | Admitting: Anesthesiology

## 2012-12-07 ENCOUNTER — Other Ambulatory Visit: Payer: Self-pay | Admitting: Obstetrics and Gynecology

## 2012-12-07 ENCOUNTER — Encounter (HOSPITAL_COMMUNITY): Payer: Self-pay | Admitting: Anesthesiology

## 2012-12-07 ENCOUNTER — Ambulatory Visit (HOSPITAL_COMMUNITY)
Admission: RE | Admit: 2012-12-07 | Discharge: 2012-12-07 | Disposition: A | Discharge status: Home / Self Care | Payer: 59 | Source: Ambulatory Visit | Attending: Obstetrics and Gynecology | Admitting: Obstetrics and Gynecology

## 2012-12-07 DIAGNOSIS — I10 Essential (primary) hypertension: Secondary | ICD-10-CM | POA: Insufficient documentation

## 2012-12-07 DIAGNOSIS — N871 Moderate cervical dysplasia: Secondary | ICD-10-CM | POA: Insufficient documentation

## 2012-12-07 DIAGNOSIS — Z9889 Other specified postprocedural states: Secondary | ICD-10-CM

## 2012-12-07 HISTORY — PX: COLPOSCOPY: SHX161

## 2012-12-07 HISTORY — PX: CERVICAL CONIZATION W/BX: SHX1330

## 2012-12-07 LAB — CBC
HCT: 41 % (ref 36.0–46.0)
Hemoglobin: 13.9 g/dL (ref 12.0–15.0)
MCH: 30 pg (ref 26.0–34.0)
RBC: 4.63 MIL/uL (ref 3.87–5.11)

## 2012-12-07 SURGERY — CONE BIOPSY, CERVIX
Anesthesia: General | Site: Vagina | Wound class: Clean Contaminated

## 2012-12-07 MED ORDER — IODINE STRONG (LUGOLS) 5 % PO SOLN
ORAL | Status: DC | PRN
  Administered 2012-12-07: 3 mL

## 2012-12-07 MED ORDER — METOCLOPRAMIDE HCL 5 MG/ML IJ SOLN: 10.0000 mg | Freq: Once | INTRAMUSCULAR | Status: DC | PRN

## 2012-12-07 MED ORDER — DEXAMETHASONE SODIUM PHOSPHATE 10 MG/ML IJ SOLN
INTRAMUSCULAR | Status: AC
  Filled 2012-12-07: qty 1

## 2012-12-07 MED ORDER — DEXAMETHASONE SODIUM PHOSPHATE 10 MG/ML IJ SOLN
INTRAMUSCULAR | Status: DC | PRN
  Administered 2012-12-07: 10 mg via INTRAVENOUS

## 2012-12-07 MED ORDER — MICROFIBRILLAR COLL HEMOSTAT EX POWD
CUTANEOUS | Status: DC | PRN
  Administered 2012-12-07: 1 g via TOPICAL

## 2012-12-07 MED ORDER — MIDAZOLAM HCL 2 MG/2ML IJ SOLN
INTRAMUSCULAR | Status: AC
  Filled 2012-12-07: qty 2

## 2012-12-07 MED ORDER — MIDAZOLAM HCL 5 MG/5ML IJ SOLN
INTRAMUSCULAR | Status: DC | PRN
  Administered 2012-12-07: 2 mg via INTRAVENOUS

## 2012-12-07 MED ORDER — MEPERIDINE HCL 25 MG/ML IJ SOLN: 6.2500 mg | INTRAMUSCULAR | Status: DC | PRN

## 2012-12-07 MED ORDER — FENTANYL CITRATE 0.05 MG/ML IJ SOLN
25.0000 ug | INTRAMUSCULAR | Status: DC | PRN
  Administered 2012-12-07: 50 ug via INTRAVENOUS

## 2012-12-07 MED ORDER — FENTANYL CITRATE 0.05 MG/ML IJ SOLN
INTRAMUSCULAR | Status: AC
  Filled 2012-12-07: qty 2

## 2012-12-07 MED ORDER — KETOROLAC TROMETHAMINE 30 MG/ML IJ SOLN
INTRAMUSCULAR | Status: AC
  Filled 2012-12-07: qty 1

## 2012-12-07 MED ORDER — CEFAZOLIN SODIUM-DEXTROSE 2-3 GM-% IV SOLR
2.0000 g | INTRAVENOUS | Status: AC
  Administered 2012-12-07: 2 g via INTRAVENOUS

## 2012-12-07 MED ORDER — LIDOCAINE-EPINEPHRINE (PF) 1 %-1:200000 IJ SOLN
INTRAMUSCULAR | Status: DC | PRN
  Administered 2012-12-07: 12 mL

## 2012-12-07 MED ORDER — LIDOCAINE HCL (CARDIAC) 20 MG/ML IV SOLN
INTRAVENOUS | Status: AC
  Filled 2012-12-07: qty 5

## 2012-12-07 MED ORDER — PROPOFOL 10 MG/ML IV EMUL
INTRAVENOUS | Status: AC
  Filled 2012-12-07: qty 20

## 2012-12-07 MED ORDER — ONDANSETRON HCL 4 MG/2ML IJ SOLN
INTRAMUSCULAR | Status: DC | PRN
  Administered 2012-12-07: 4 mg via INTRAVENOUS

## 2012-12-07 MED ORDER — ONDANSETRON HCL 4 MG/2ML IJ SOLN
INTRAMUSCULAR | Status: AC
  Filled 2012-12-07: qty 2

## 2012-12-07 MED ORDER — LACTATED RINGERS IV SOLN
INTRAVENOUS | Status: DC
  Administered 2012-12-07 (×2): via INTRAVENOUS

## 2012-12-07 MED ORDER — PROPOFOL 10 MG/ML IV BOLUS
INTRAVENOUS | Status: DC | PRN
  Administered 2012-12-07: 200 mg via INTRAVENOUS

## 2012-12-07 MED ORDER — ACETIC ACID 4% SOLUTION
Status: DC | PRN
  Administered 2012-12-07: 5 via TOPICAL

## 2012-12-07 MED ORDER — FENTANYL CITRATE 0.05 MG/ML IJ SOLN
INTRAMUSCULAR | Status: DC | PRN
  Administered 2012-12-07 (×2): 50 ug via INTRAVENOUS
  Administered 2012-12-07: 100 ug via INTRAVENOUS

## 2012-12-07 MED ORDER — PHENYLEPHRINE HCL 10 MG/ML IJ SOLN
INTRAMUSCULAR | Status: DC | PRN
  Administered 2012-12-07 (×5): 40 ug via INTRAVENOUS

## 2012-12-07 MED ORDER — FERRIC SUBSULFATE SOLN
Status: DC | PRN
  Administered 2012-12-07: 1

## 2012-12-07 MED ORDER — KETOROLAC TROMETHAMINE 30 MG/ML IJ SOLN
INTRAMUSCULAR | Status: DC | PRN
  Administered 2012-12-07: 30 mg via INTRAVENOUS

## 2012-12-07 MED ORDER — LIDOCAINE-EPINEPHRINE (PF) 1 %-1:200000 IJ SOLN
INTRAMUSCULAR | Status: AC
  Filled 2012-12-07: qty 10

## 2012-12-07 MED ORDER — OXYCODONE-ACETAMINOPHEN 5-325 MG PO TABS: ORAL_TABLET | ORAL | Status: DC

## 2012-12-07 MED ORDER — LIDOCAINE HCL (CARDIAC) 20 MG/ML IV SOLN
INTRAVENOUS | Status: DC | PRN
  Administered 2012-12-07: 60 mg via INTRAVENOUS

## 2012-12-07 MED ORDER — MICROFIBRILLAR COLL HEMOSTAT EX POWD
CUTANEOUS | Status: AC
  Filled 2012-12-07: qty 5

## 2012-12-07 MED ORDER — FENTANYL CITRATE 0.05 MG/ML IJ SOLN
INTRAMUSCULAR | Status: AC
  Administered 2012-12-07: 50 ug via INTRAVENOUS
  Filled 2012-12-07: qty 2

## 2012-12-07 MED ORDER — CEFAZOLIN SODIUM-DEXTROSE 2-3 GM-% IV SOLR
INTRAVENOUS | Status: AC
  Filled 2012-12-07: qty 50

## 2012-12-07 MED ORDER — PHENYLEPHRINE 40 MCG/ML (10ML) SYRINGE FOR IV PUSH (FOR BLOOD PRESSURE SUPPORT)
PREFILLED_SYRINGE | INTRAVENOUS | Status: AC
  Filled 2012-12-07: qty 5

## 2012-12-07 SURGICAL SUPPLY — 34 items
APPLICATOR COTTON TIP 6IN STRL (MISCELLANEOUS) IMPLANT
BLADE SURG 11 STRL SS (BLADE) ×2 IMPLANT
CATH ROBINSON RED A/P 16FR (CATHETERS) ×2 IMPLANT
CLOTH BEACON ORANGE TIMEOUT ST (SAFETY) ×2 IMPLANT
CONTAINER PREFILL 10% NBF 60ML (FORM) ×6 IMPLANT
COUNTER NEEDLE 1200 MAGNETIC (NEEDLE) IMPLANT
DRESSING TELFA 8X3 (GAUZE/BANDAGES/DRESSINGS) ×2 IMPLANT
ELECT BALL LEEP 5MM RED (ELECTRODE) ×2 IMPLANT
ELECT LOOP LEEP SQR 10X10 ORG (CUTTING LOOP) ×2
ELECT REM PT RETURN 9FT ADLT (ELECTROSURGICAL) ×2
ELECTRODE LOOP LP SQR 10X10ORG (CUTTING LOOP) ×1 IMPLANT
ELECTRODE REM PT RTRN 9FT ADLT (ELECTROSURGICAL) ×1 IMPLANT
GAUZE SPONGE 4X4 16PLY XRAY LF (GAUZE/BANDAGES/DRESSINGS) IMPLANT
GLOVE BIO SURGEON STRL SZ7 (GLOVE) ×2 IMPLANT
GLOVE BIOGEL PI IND STRL 7.0 (GLOVE) ×1 IMPLANT
GLOVE BIOGEL PI INDICATOR 7.0 (GLOVE) ×1
GOWN STRL REIN XL XLG (GOWN DISPOSABLE) ×4 IMPLANT
HEMOSTAT SURGICEL 2X3 (HEMOSTASIS) IMPLANT
NEEDLE SPNL 22GX3.5 QUINCKE BK (NEEDLE) ×2 IMPLANT
NS IRRIG 1000ML POUR BTL (IV SOLUTION) ×2 IMPLANT
PACK VAGINAL MINOR WOMEN LF (CUSTOM PROCEDURE TRAY) ×2 IMPLANT
PAD OB MATERNITY 4.3X12.25 (PERSONAL CARE ITEMS) ×2 IMPLANT
PENCIL BUTTON HOLSTER BLD 10FT (ELECTRODE) ×2 IMPLANT
SCOPETTES 8  STERILE (MISCELLANEOUS) ×2
SCOPETTES 8 STERILE (MISCELLANEOUS) ×2 IMPLANT
SPONGE SURGIFOAM ABS GEL 12-7 (HEMOSTASIS) IMPLANT
SUT VIC AB 0 CT1 27 (SUTURE) ×2
SUT VIC AB 0 CT1 27XBRD ANBCTR (SUTURE) ×2 IMPLANT
SUT VICRYL 0 UR6 27IN ABS (SUTURE) ×6 IMPLANT
SYR CONTROL 10ML LL (SYRINGE) ×2 IMPLANT
TOWEL OR 17X24 6PK STRL BLUE (TOWEL DISPOSABLE) ×4 IMPLANT
TUBING CONNECTING 10 (TUBING) ×2 IMPLANT
WATER STERILE IRR 1000ML POUR (IV SOLUTION) ×2 IMPLANT
YANKAUER SUCT BULB TIP NO VENT (SUCTIONS) ×2 IMPLANT

## 2012-12-07 NOTE — Anesthesia Postprocedure Evaluation (Signed)
  Anesthesia Post-op Note  Patient: Danielle Gould  Procedure(s) Performed: Procedure(s): CONIZATION CERVIX WITH BIOPSY (N/A) COLPOSCOPY (N/A)  Patient Location: PACU  Anesthesia Type:General  Level of Consciousness: awake, alert  and oriented  Airway and Oxygen Therapy: Patient Spontanous Breathing  Post-op Pain: none  Post-op Assessment: Post-op Vital signs reviewed, Patient's Cardiovascular Status Stable, Respiratory Function Stable, Patent Airway, No signs of Nausea or vomiting and Pain level controlled  Post-op Vital Signs: Reviewed and stable  Complications: No apparent anesthesia complications

## 2012-12-07 NOTE — Brief Op Note (Signed)
12/07/2012  10:33 AM  PATIENT:  Danielle Gould  42 y.o. 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), Pap/Colpo discrepancy CPT 57520  POST-OPERATIVE DIAGNOSIS:  Same  PROCEDURE:  Procedure(s): CONIZATION CERVIX WITH BIOPSY (N/A) COLPOSCOPY (N/A) LEEP  SURGEON:  Surgeon(s) and Role:    * Geryl Rankins, MD - Primary  PHYSICIAN ASSISTANT:   ASSISTANTS: Technician   ANESTHESIA:   general  EBL:  Total I/O In: 1000 [I.V.:1000] Out: 30 [Urine:30] EBL 50 cc  BLOOD ADMINISTERED:none  DRAINS: none   LOCAL MEDICATIONS USED:  1% XYLOCAINE 1:200 epinephrine  SPECIMEN:  Source of Specimen:  CKC, 8-12 cervix, posterior cervix  DISPOSITION OF SPECIMEN:  PATHOLOGY  COUNTS:  YES  TOURNIQUET:  * No tourniquets in log *  DICTATION: .Other Dictation: Dictation Number [id number]  PLAN OF CARE: Discharge to home after PACU  PATIENT DISPOSITION:  PACU - hemodynamically stable.   Delay start of Pharmacological VTE agent (>24hrs) due to surgical blood loss or risk of bleeding: not applicable

## 2012-12-07 NOTE — Anesthesia Preprocedure Evaluation (Addendum)
Anesthesia Evaluation  Patient identified by MRN, date of birth, ID band Patient awake    Reviewed: Allergy & Precautions, H&P , NPO status , Patient's Chart, lab work & pertinent test results, reviewed documented beta blocker date and time   Airway Mallampati: III TM Distance: >3 FB Neck ROM: Full    Dental  (+) Chipped and Missing   Pulmonary Current Smoker,  breath sounds clear to auscultation  Pulmonary exam normal       Cardiovascular hypertension, Pt. on medications and Pt. on home beta blockers Rhythm:Regular Rate:Normal     Neuro/Psych  Headaches, Insomnia   GI/Hepatic Neg liver ROS, Chronic Diarrhea   Endo/Other  negative endocrine ROS  Renal/GU negative Renal ROS  negative genitourinary   Musculoskeletal negative musculoskeletal ROS (+)   Abdominal Normal abdominal exam  (+)   Peds  Hematology negative hematology ROS (+)   Anesthesia Other Findings   Reproductive/Obstetrics                          Anesthesia Physical Anesthesia Plan  ASA: II  Anesthesia Plan: General   Post-op Pain Management:    Induction: Intravenous  Airway Management Planned: LMA  Additional Equipment:   Intra-op Plan:   Post-operative Plan: Extubation in OR  Informed Consent: I have reviewed the patients History and Physical, chart, labs and discussed the procedure including the risks, benefits and alternatives for the proposed anesthesia with the patient or authorized representative who has indicated his/her understanding and acceptance.   Dental advisory given  Plan Discussed with:   Anesthesia Plan Comments:         Anesthesia Quick Evaluation

## 2012-12-07 NOTE — Interval H&P Note (Signed)
History and Physical Interval Note:  12/07/2012 8:42 AM  Danielle Gould  has presented today for surgery, with the diagnosis of Cervical Dysplasia CPT [phone]  The various methods of treatment have been discussed with the patient and family. After consideration of risks, benefits and other options for treatment, the patient has consented to  Procedure(s): CONIZATION CERVIX WITH BIOPSY (N/A) COLPOSCOPY (N/A) as a surgical intervention .  The patient's history has been reviewed, patient examined, no change in status, stable for surgery.  I have reviewed the patient's chart and labs.  Questions were answered to the patient's satisfaction.     Dion Body, Thai Hemrick

## 2012-12-07 NOTE — H&P (Signed)
11/23/2012  History of Present Illness  General:  42 y/o G1P1 presents for preop for CKC. 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 Her primary gyn at the time sent her to a gyn/oncologist b/c he said it was everywhere. Gyn/onc confirmed LGSIL with one primary lesion. Recent pap showed HGSIL/CIS. Colposcopy showed CIN1 and CIN 2 fragments on ECC. CKC recommended for more definitve diagnosis and treatment. Pt originally wanted a hysterectomy but today she states she wants to have a baby with her ex-boyfriend with home she has reconciled.  Reports last menses was not normal. Had spotting. H/o neg pregnancy test x 3 months before pregnancy was confirmed. Requests pregnancy test.  Current Medications  Taking Metoprolol Tartrate 50 mg Tablet 1 tablet Twice a day  Not-Taking/PRN Ambien 10 mg Tablet 1 tablet at bedtime Once a day  Not-Taking/PRN Xanax 0.5 MG Tablet 1 tablet Three times a day  Discontinued Bactrim DS 800-160 MG Tablet 1 tablet every 12 hrs  Discontinued Nystatin-Triamcinolone 100000-0.1 UNIT/GM-% Ointment 1 application to affected area Twice a day  Past Medical History  Hypertension  Anxiety  Surgical History  Gallbladder removed 03/2012  Family History  Mother: alive, Breast cancer diagnosed at age 24, uterine cancer  Maternal Grand Mother: Breast cancer, Ovarian cancer  2 maternal aunts with breast and ovarian cancer.  Social History  General:  History of smoking  cigarettes: Current smoker Frequency: 1/2 PPD Estimated Pack-years: 25 Smoking: yes, 1/3 pk.  Alcohol: yes, social.  no Recreational drug use.  Diet: started Weight Watchers a couple of weeks ago.  Exercise: daily, 20 minutes am and pm on stationary bike.  Occupation: Compliance Production designer, theatre/television/film.  Marital Status: Separated.  Children: Boys, 1.   Gyn History  Sexual activity currently sexually active.  Periods : every month.  LMP 11/20/2012.  Birth control condoms.  Last pap smear date 08/2012,  HGSIL, CIN 1 & 2.  Denies H/O Last mammogram date.  Abnormal pap smear assessed with colposcopy, 04/2011.  STD HPV.   OB History  Number of pregnancies 1.  Pregnancy # 1 live birth, vaginal delivery, boy.   Allergies  N.K.D.A.  Hospitalization/Major Diagnostic Procedure  childbirth x 1   Vital Signs  Wt 196, Wt change 1 lb, Ht 64.5, BMI 33.12, Pulse sitting 78, BP sitting 124/80.  Physical Examination  GENERAL:  Patient appears alert and oriented.  General Appearance: well-appearing, well-developed, no acute distress.  Speech: clear.  NECK:  Thyroid: no thyromegaly.  LUNGS:  General clear bilaterally, no crackles, no wheezes.  HEART:  Heart sounds: normal, RRR, no murmur.  ABDOMEN:  General: no masses tenderness or organomegaly, obese, soft.  FEMALE GENITOURINARY:  General deferred.  EXTREMITIES:  General: No edema, no calf tenderness.    Assessments  1. Pre-op exam - V72.84 (Primary)  2. CIN II (cervical intraepithelial neoplasia II) - 622.12, CIS on pap. CIN 2 on ECC.  3. Abnormal menses - 626.9  Treatment  1. Pre-op exam  Notes: Pt counseled on rationale of procedure. Counseled on R/B/A of CKC, including but not limited to bleeding and injury to uterus. Discussed possible risk to future pregnancy including cervical incompetence and cervical stenosis. All questions answered. Consent obtained.    2. Abnormal menses  LAB: Pregnancy Test, Urine LAB: Pregnancy Beta HCG Quant Notes: Pt desires pregnancy. Due to AMA, recommend FSH and AMH on Day 3 of next menses.   Procedures  Venipuncture:  Venipuncture: Smith,Michele 11/23/2012 10:51:40 AM > , performed in left  arm.          Labs  Lab: Pregnancy Test, Urine  Pregnancy Test, Urine negative              Lab: Pregnancy Beta HCG Quant Negative  BETA HCG QUANTITATIVE <0.5  - MiU/ML             Procedure Codes  40981 ECL BETA HCG QUANT  81025 URINE PREGNANCY TEST  36415 BLOOD COLLECTION ROUTINE VENIPUNCTURE    Follow Up  2 Weeks post op

## 2012-12-07 NOTE — H&P (View-Only) (Signed)
11/23/2012  History of Present Illness  General:  42 y/o G1P1 presents for preop for CKC. 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 Her primary gyn at the time sent her to a gyn/oncologist b/c he said it was everywhere. Gyn/onc confirmed LGSIL with one primary lesion. Recent pap showed HGSIL/CIS. Colposcopy showed CIN1 and CIN 2 fragments on ECC. CKC recommended for more definitve diagnosis and treatment. Pt originally wanted a hysterectomy but today she states she wants to have a baby with her ex-boyfriend with home she has reconciled.  Reports last menses was not normal. Had spotting. H/o neg pregnancy test x 3 months before pregnancy was confirmed. Requests pregnancy test.  Current Medications  Taking Metoprolol Tartrate 50 mg Tablet 1 tablet Twice a day  Not-Taking/PRN Ambien 10 mg Tablet 1 tablet at bedtime Once a day  Not-Taking/PRN Xanax 0.5 MG Tablet 1 tablet Three times a day  Discontinued Bactrim DS 800-160 MG Tablet 1 tablet every 12 hrs  Discontinued Nystatin-Triamcinolone 100000-0.1 UNIT/GM-% Ointment 1 application to affected area Twice a day  Past Medical History  Hypertension  Anxiety  Surgical History  Gallbladder removed 03/2012  Family History  Mother: alive, Breast cancer diagnosed at age 38, uterine cancer  Maternal Grand Mother: Breast cancer, Ovarian cancer  2 maternal aunts with breast and ovarian cancer.  Social History  General:  History of smoking  cigarettes: Current smoker Frequency: 1/2 PPD Estimated Pack-years: 25 Smoking: yes, 1/3 pk.  Alcohol: yes, social.  no Recreational drug use.  Diet: started Weight Watchers a couple of weeks ago.  Exercise: daily, 20 minutes am and pm on stationary bike.  Occupation: Compliance manager.  Marital Status: Separated.  Children: Boys, 1.   Gyn History  Sexual activity currently sexually active.  Periods : every month.  LMP 11/20/2012.  Birth control condoms.  Last pap smear date 08/2012,  HGSIL, CIN 1 & 2.  Denies H/O Last mammogram date.  Abnormal pap smear assessed with colposcopy, 04/2011.  STD HPV.   OB History  Number of pregnancies 1.  Pregnancy # 1 live birth, vaginal delivery, boy.   Allergies  N.K.D.A.  Hospitalization/Major Diagnostic Procedure  childbirth x 1   Vital Signs  Wt 196, Wt change 1 lb, Ht 64.5, BMI 33.12, Pulse sitting 78, BP sitting 124/80.  Physical Examination  GENERAL:  Patient appears alert and oriented.  General Appearance: well-appearing, well-developed, no acute distress.  Speech: clear.  NECK:  Thyroid: no thyromegaly.  LUNGS:  General clear bilaterally, no crackles, no wheezes.  HEART:  Heart sounds: normal, RRR, no murmur.  ABDOMEN:  General: no masses tenderness or organomegaly, obese, soft.  FEMALE GENITOURINARY:  General deferred.  EXTREMITIES:  General: No edema, no calf tenderness.    Assessments  1. Pre-op exam - V72.84 (Primary)  2. CIN II (cervical intraepithelial neoplasia II) - 622.12, CIS on pap. CIN 2 on ECC.  3. Abnormal menses - 626.9  Treatment  1. Pre-op exam  Notes: Pt counseled on rationale of procedure. Counseled on R/B/A of CKC, including but not limited to bleeding and injury to uterus. Discussed possible risk to future pregnancy including cervical incompetence and cervical stenosis. All questions answered. Consent obtained.    2. Abnormal menses  LAB: Pregnancy Test, Urine LAB: Pregnancy Beta HCG Quant Notes: Pt desires pregnancy. Due to AMA, recommend FSH and AMH on Day 3 of next menses.   Procedures  Venipuncture:  Venipuncture: Smith,Michele 11/23/2012 10:51:40 AM > , performed in left   arm.          Labs  Lab: Pregnancy Test, Urine  Pregnancy Test, Urine negative              Lab: Pregnancy Beta HCG Quant Negative  BETA HCG QUANTITATIVE <0.5  - MiU/ML             Procedure Codes  84702 ECL BETA HCG QUANT  81025 URINE PREGNANCY TEST  36415 BLOOD COLLECTION ROUTINE VENIPUNCTURE    Follow Up  2 Weeks post op    

## 2012-12-07 NOTE — Transfer of Care (Signed)
Immediate Anesthesia Transfer of Care Note  Patient: Danielle Gould  Procedure(s) Performed: Procedure(s): CONIZATION CERVIX WITH BIOPSY (N/A) COLPOSCOPY (N/A)  Patient Location: PACU  Anesthesia Type:General  Level of Consciousness: sedated  Airway & Oxygen Therapy: Patient Spontanous Breathing and Patient connected to nasal cannula oxygen  Post-op Assessment: Report given to PACU RN and Post -op Vital signs reviewed and stable  Post vital signs: stable  Complications: No apparent anesthesia complications

## 2012-12-08 ENCOUNTER — Encounter (HOSPITAL_COMMUNITY): Payer: Self-pay | Admitting: Obstetrics and Gynecology

## 2012-12-08 NOTE — Op Note (Signed)
[name], Gould                   ACCOUNT NO.:  [id number]  MEDICAL RECORD NO.:  [id number]  LOCATION:  [facility]                          FACILITY:  [facility]  PHYSICIAN:  Pieter Partridge, MD   DATE OF BIRTH:  01-Mar-1971  DATE OF PROCEDURE:  12/07/2012 DATE OF DISCHARGE:  12/07/2012                              OPERATIVE REPORT   PREOPERATIVE DIAGNOSIS:  Cervical dysplasia, CIN 2 on ECC, Pap colpo discrepancy.  POSTOPERATIVE DIAGNOSIS:  Cervical dysplasia, Pap/colpo discrepancy-CIN 3/CIS on pap vs. CIN 2 on ECC  PROCEDURE:  Conization of the endocervix, LEEP ectocervix and colposcopy.  SURGEON:  Pieter Partridge, MD  ASSISTANT:  Technician.  ANESTHESIA:  General.  EBL:  50 mL approximately.  ANESTHESIA:  Local with 1% Xylocaine with 1 in 200 epi.  SOURCE OF SPECIMEN:  CKC, 8-12.  PORTION OF CERVIX:  Posterior cervix.  DISPOSITION:  To Pathology.  To PACU, hemodynamically stable.  COMPLICATIONS:  None.  FINDINGS:  Acetic acid and Lugol's, large lesion wrapping circumferentially from 12 all the way to about 5 o'clock on the cervix which was lateral to the transformation zone.  Cold knife cone.  No lesion extending into the endocervix few mm.  No discrete cervical masses.  The patient had lots of uterine prolapse and vaginal side wall prolapse.  INDICATIONS FOR PROCEDURE:  Danielle Gould is a 42 year old gravida 1, para 1, who originally had a low-grade Pap in 2012.  Upon colposcopy by her GYN at that time, she was told that she had lesions every where.  She was then referred to GYN Oncology who confirmed a low-grade lesion and recommended close followup.  The patient was also followed about a year and a half when a repeat Pap was done in my office.  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  Colposcopy was performed and biopsy showed CIN 1 and then CIN 2 on the endocervix.  Due to the patient's discomfort, the colposcopy was suboptimal due to the patient moved a lot on  the table.  I recommended repeating colposcopy under anesthesia to get an adequate look at the vaginal sidewall and a good look at the endocervical canal and especially due to the Pap colpo discrepancy of CIN 2 and CIS on the Pap.  Informed consent was obtained.  PROCEDURE IN DETAIL:  Danielle Gould was identified in the holding area.  She was then taken to the operating room, where she underwent general endotracheal anesthesia without complication.  She was then prepped and draped in normal sterile fashion and placed in the dorsal lithotomy position prior to prepping and draping.  Initially, I had a Graves speculum to visualize the cervix, but due to the laxity and prolapse, it was difficult to visualize and ultimately ended up using a weighted speculum and a Deaver, however, the colposcopy was performed with the Graves in place.  The Lugol's and acetic acid with findings were noted above.  No green light filter was available.  A paracervical block deep into the cervical stroma was performed with the epinephrine after 2 stay sutures were placed at 3 and 12 o'clock.  I started with the LEEP from  12-8 and that was marked.  The margins were obtained.  I started with the cold knife cone, where a circumferential incision was made on the cervix with a 11 blade and then carried down in a cone fashion to the depth of the endocervix.  The uterine sound was used to get the adequate depth.  The cone specimen was then removed. Hemostasis was achieved with the Bovie cautery.  On the posterior lip and endocervical portion, there was still some bleeding, so 0 Vicryl was used to run the age of that with adequate hemostasis.  Avitene was then applied.  I cut the stay sutures and those sites were hemostatic with Bovie. Single-tooth tenaculum site was not responsive to Bovie cautery or Monsel's.  So I put 1 single interrupted stitch in the anterior lip of the cervix.  Again by the time almost ready to  remove all instruments, there was no bleeding noted.  The Avitene and the Monsel's had provided adequate hemostasis.  All instruments were removed from the vagina.  The CKC was measured in its largest portion.  It was a little under 2 cm and the smallest that was 1 cm.  On the smaller side, a portion of the transformation zone had been removed posteriorly.  All instrument, sponge, and needle counts were correct x3.  The patient went to recovery room in stable condition.  She received Ancef 2 g IV prior to the procedure and had SCDs on and operating the entire time.  Of note, at the time of colposcopy, an endocervical speculum was used to visualize the depth of the endocervical lesion.     Pieter Partridge, MD     EBV/MEDQ  D:  12/07/2012  T:  12/08/2012  Job:  [id number]

## 2014-02-22 ENCOUNTER — Other Ambulatory Visit (HOSPITAL_COMMUNITY)
Admission: RE | Admit: 2014-02-22 | Discharge: 2014-02-22 | Disposition: A | Discharge status: Home / Self Care | Payer: 59 | Source: Ambulatory Visit | Attending: Obstetrics and Gynecology | Admitting: Obstetrics and Gynecology

## 2014-02-22 ENCOUNTER — Other Ambulatory Visit: Payer: Self-pay | Admitting: Obstetrics and Gynecology

## 2014-02-22 DIAGNOSIS — Z124 Encounter for screening for malignant neoplasm of cervix: Secondary | ICD-10-CM | POA: Insufficient documentation

## 2014-02-22 DIAGNOSIS — Z1151 Encounter for screening for human papillomavirus (HPV): Secondary | ICD-10-CM | POA: Insufficient documentation

## 2014-02-27 LAB — CYTOLOGY - PAP

## 2014-07-06 ENCOUNTER — Other Ambulatory Visit: Payer: Self-pay

## 2014-07-06 DIAGNOSIS — Z1231 Encounter for screening mammogram for malignant neoplasm of breast: Secondary | ICD-10-CM

## 2014-07-31 ENCOUNTER — Ambulatory Visit
Admission: RE | Admit: 2014-07-31 | Discharge: 2014-07-31 | Disposition: A | Discharge status: Home / Self Care | Payer: 59 | Source: Ambulatory Visit

## 2014-07-31 DIAGNOSIS — Z1231 Encounter for screening mammogram for malignant neoplasm of breast: Secondary | ICD-10-CM

## 2015-04-20 ENCOUNTER — Other Ambulatory Visit: Payer: Self-pay | Admitting: Obstetrics and Gynecology

## 2015-04-20 ENCOUNTER — Other Ambulatory Visit (HOSPITAL_COMMUNITY)
Admission: RE | Admit: 2015-04-20 | Discharge: 2015-04-20 | Disposition: A | Discharge status: Home / Self Care | Payer: 59 | Source: Ambulatory Visit | Attending: Obstetrics and Gynecology | Admitting: Obstetrics and Gynecology

## 2015-04-20 DIAGNOSIS — Z01419 Encounter for gynecological examination (general) (routine) without abnormal findings: Secondary | ICD-10-CM | POA: Diagnosis present

## 2015-04-20 DIAGNOSIS — Z1151 Encounter for screening for human papillomavirus (HPV): Secondary | ICD-10-CM | POA: Insufficient documentation

## 2015-04-25 LAB — CYTOLOGY - PAP

## 2015-08-21 ENCOUNTER — Other Ambulatory Visit: Payer: Self-pay | Admitting: Obstetrics and Gynecology

## 2015-08-21 DIAGNOSIS — Z975 Presence of (intrauterine) contraceptive device: Secondary | ICD-10-CM

## 2015-08-29 ENCOUNTER — Ambulatory Visit (HOSPITAL_COMMUNITY): Payer: 59 | Attending: Obstetrics and Gynecology

## 2015-09-27 ENCOUNTER — Other Ambulatory Visit: Payer: Self-pay

## 2015-09-27 DIAGNOSIS — Z1231 Encounter for screening mammogram for malignant neoplasm of breast: Secondary | ICD-10-CM

## 2015-10-15 ENCOUNTER — Ambulatory Visit: Payer: 59

## 2016-06-04 ENCOUNTER — Other Ambulatory Visit: Payer: Self-pay | Admitting: Obstetrics and Gynecology

## 2016-06-04 DIAGNOSIS — Z1231 Encounter for screening mammogram for malignant neoplasm of breast: Secondary | ICD-10-CM

## 2016-06-20 ENCOUNTER — Ambulatory Visit
Admission: RE | Admit: 2016-06-20 | Discharge: 2016-06-20 | Disposition: A | Discharge status: Home / Self Care | Payer: 59 | Source: Ambulatory Visit | Attending: Obstetrics and Gynecology | Admitting: Obstetrics and Gynecology

## 2016-06-20 DIAGNOSIS — Z1231 Encounter for screening mammogram for malignant neoplasm of breast: Secondary | ICD-10-CM

## 2016-07-23 ENCOUNTER — Other Ambulatory Visit (HOSPITAL_COMMUNITY)
Admission: RE | Admit: 2016-07-23 | Discharge: 2016-07-23 | Disposition: A | Discharge status: Home / Self Care | Payer: 59 | Source: Ambulatory Visit | Attending: Obstetrics and Gynecology | Admitting: Obstetrics and Gynecology

## 2016-07-23 ENCOUNTER — Other Ambulatory Visit: Payer: Self-pay | Admitting: Obstetrics and Gynecology

## 2016-07-23 DIAGNOSIS — Z01411 Encounter for gynecological examination (general) (routine) with abnormal findings: Secondary | ICD-10-CM | POA: Insufficient documentation

## 2016-07-23 DIAGNOSIS — Z1151 Encounter for screening for human papillomavirus (HPV): Secondary | ICD-10-CM | POA: Insufficient documentation

## 2016-07-29 LAB — CYTOLOGY - PAP
Diagnosis: NEGATIVE
HPV (WINDOPATH): NOT DETECTED

## 2016-08-05 ENCOUNTER — Other Ambulatory Visit: Payer: Self-pay | Admitting: Family Medicine

## 2016-08-05 DIAGNOSIS — R51 Headache: Principal | ICD-10-CM

## 2016-08-05 DIAGNOSIS — R519 Headache, unspecified: Secondary | ICD-10-CM

## 2016-08-11 ENCOUNTER — Ambulatory Visit
Admission: RE | Admit: 2016-08-11 | Discharge: 2016-08-11 | Disposition: A | Discharge status: Home / Self Care | Payer: 59 | Source: Ambulatory Visit | Attending: Family Medicine | Admitting: Family Medicine

## 2016-08-11 DIAGNOSIS — R51 Headache: Principal | ICD-10-CM

## 2016-08-11 DIAGNOSIS — R519 Headache, unspecified: Secondary | ICD-10-CM

## 2017-06-23 ENCOUNTER — Other Ambulatory Visit: Payer: Self-pay | Admitting: Urology

## 2017-06-23 DIAGNOSIS — N361 Urethral diverticulum: Secondary | ICD-10-CM

## 2017-06-29 ENCOUNTER — Ambulatory Visit (HOSPITAL_COMMUNITY)
Admission: RE | Admit: 2017-06-29 | Discharge: 2017-06-29 | Disposition: A | Discharge status: Home / Self Care | Payer: 59 | Source: Ambulatory Visit | Attending: Urology | Admitting: Urology

## 2017-06-29 DIAGNOSIS — R319 Hematuria, unspecified: Secondary | ICD-10-CM | POA: Insufficient documentation

## 2017-06-29 DIAGNOSIS — Z975 Presence of (intrauterine) contraceptive device: Secondary | ICD-10-CM | POA: Insufficient documentation

## 2017-06-29 DIAGNOSIS — N361 Urethral diverticulum: Secondary | ICD-10-CM

## 2017-06-29 LAB — POCT I-STAT CREATININE: CREATININE: 0.8 mg/dL (ref 0.44–1.00)

## 2017-06-29 MED ORDER — GADOBENATE DIMEGLUMINE 529 MG/ML IV SOLN
20.0000 mL | Freq: Once | INTRAVENOUS | Status: AC | PRN
  Administered 2017-06-29: 20 mL via INTRAVENOUS

## 2017-07-02 ENCOUNTER — Ambulatory Visit (HOSPITAL_COMMUNITY): Payer: 59

## 2017-08-12 ENCOUNTER — Other Ambulatory Visit: Payer: Self-pay | Admitting: Urology

## 2017-09-02 NOTE — Patient Instructions (Addendum)
Danielle Gould  09/02/2017   Your procedure is scheduled on: 09-08-17   [facility]  Entrance Follow signs to Short Stay on first floor at 530 AM    Call this number if you have problems the morning of surgery [phone]   Remember: Do not eat food or drink liquids :After Midnight.     Take these medicines the morning of surgery with A SIP OF WATER: Metoprolol Tartrate (Lopresssor)                                You may not have any metal on your body including hair pins and              piercings  Do not wear jewelry, make-up, lotions, powders or perfumes, deodorant             Do not wear nail polish.  Do not shave  48 hours prior to surgery.           Do not bring valuables to the hospital. [facility] IS NOT             RESPONSIBLE   FOR VALUABLES.  Contacts, dentures or bridgework may not be worn into surgery. .     Patients discharged the day of surgery will not be allowed to drive home.  Name and phone number of your driver: Melanie Crazierony Perlow [phone]                Please read over the following fact sheets you were given: _____________________________________________________________________             [facility] - Preparing for [facility] Before surgery, you can play an important role.  Because skin is not sterile, your skin needs to be as free of germs as possible.  You can reduce the number of germs on your skin by washing with CHG (chlorahexidine gluconate) soap before surgery.  CHG is an antiseptic cleaner which kills germs and bonds with the skin to continue killing germs even after washing. Please DO NOT use if you have an allergy to CHG or antibacterial soaps.  If your skin becomes reddened/irritated stop using the CHG and inform your nurse when you arrive at Short Stay. Do not shave (including legs and underarms) for at least 48 hours prior to the first CHG shower.  You may shave your face/neck. Please follow these instructions  carefully:  1.  Shower with CHG Soap the night before surgery and the  morning of Surgery.  2.  If you choose to wash your hair, wash your hair first as usual with your  normal  shampoo.  3.  After you shampoo, rinse your hair and body thoroughly to remove the  shampoo.                           4.  Use CHG as you would any other liquid soap.  You can apply chg directly  to the skin and wash                       Gently with a scrungie or clean washcloth.  5.  Apply the CHG Soap to your body ONLY FROM THE NECK DOWN.   Do not use on face/ open  Wound or open sores. Avoid contact with eyes, ears mouth and genitals (private parts).                       Wash face,  Genitals (private parts) with your normal soap.             6.  Wash thoroughly, paying special attention to the area where your surgery  will be performed.  7.  Thoroughly rinse your body with warm water from the neck down.  8.  DO NOT shower/wash with your normal soap after using and rinsing off  the CHG Soap.                9.  Pat yourself dry with a clean towel.            10.  Wear clean pajamas.            11.  Place clean sheets on your bed the night of your first shower and do not  sleep with pets. Day of Surgery : Do not apply any lotions/deodorants the morning of surgery.  Please wear clean clothes to the hospital/surgery center.  FAILURE TO FOLLOW THESE INSTRUCTIONS MAY RESULT IN THE CANCELLATION OF YOUR SURGERY PATIENT SIGNATURE_________________________________  NURSE SIGNATURE__________________________________  ________________________________________________________________________

## 2017-09-03 ENCOUNTER — Encounter (HOSPITAL_COMMUNITY)
Admission: RE | Admit: 2017-09-03 | Discharge: 2017-09-03 | Disposition: A | Discharge status: Home / Self Care | Payer: 59 | Source: Ambulatory Visit | Attending: Urology | Admitting: Urology

## 2017-09-03 ENCOUNTER — Encounter (HOSPITAL_COMMUNITY): Payer: Self-pay

## 2017-09-03 ENCOUNTER — Other Ambulatory Visit: Payer: Self-pay

## 2017-09-03 DIAGNOSIS — Z01812 Encounter for preprocedural laboratory examination: Secondary | ICD-10-CM | POA: Diagnosis not present

## 2017-09-03 DIAGNOSIS — Y838 Other surgical procedures as the cause of abnormal reaction of the patient, or of later complication, without mention of misadventure at the time of the procedure: Secondary | ICD-10-CM | POA: Diagnosis not present

## 2017-09-03 DIAGNOSIS — T83721A Exposure of implanted vaginal mesh and other prosthetic materials into vagina, initial encounter: Secondary | ICD-10-CM | POA: Insufficient documentation

## 2017-09-03 DIAGNOSIS — I10 Essential (primary) hypertension: Secondary | ICD-10-CM | POA: Insufficient documentation

## 2017-09-03 DIAGNOSIS — Z01818 Encounter for other preprocedural examination: Secondary | ICD-10-CM | POA: Diagnosis not present

## 2017-09-03 HISTORY — DX: Failed or difficult intubation, initial encounter: T88.4XXA

## 2017-09-03 LAB — CBC
HCT: 39.5 % (ref 36.0–46.0)
HEMOGLOBIN: 13.2 g/dL (ref 12.0–15.0)
MCH: 28.6 pg (ref 26.0–34.0)
MCHC: 33.4 g/dL (ref 30.0–36.0)
MCV: 85.7 fL (ref 78.0–100.0)
Platelets: 259 10*3/uL (ref 150–400)
RBC: 4.61 MIL/uL (ref 3.87–5.11)
RDW: 13.8 % (ref 11.5–15.5)
WBC: 8.3 10*3/uL (ref 4.0–10.5)

## 2017-09-03 LAB — BASIC METABOLIC PANEL
ANION GAP: 6 (ref 5–15)
BUN: 14 mg/dL (ref 6–20)
CHLORIDE: 105 mmol/L (ref 101–111)
CO2: 25 mmol/L (ref 22–32)
Calcium: 8.8 mg/dL — ABNORMAL LOW (ref 8.9–10.3)
Creatinine, Ser: 0.79 mg/dL (ref 0.44–1.00)
GFR calc non Af Amer: >60 mL/min (ref >60)
Glucose, Bld: 92 mg/dL (ref 65–99)
Potassium: 4.1 mmol/L (ref 3.5–5.1)
Sodium: 136 mmol/L (ref 135–145)

## 2017-09-03 LAB — HCG, SERUM, QUALITATIVE: Preg, Serum: NEGATIVE

## 2017-09-03 NOTE — Progress Notes (Signed)
Pt reports that she has had diffulty with intubation x 2. Last episode was as recent as 8111-18. Spoke to Dr. Chaney MallingHodierne, Anesthesiologist. The will evaluate patient prior to surgical procedure. Information added to special needs chart.

## 2017-09-07 MED ORDER — GENTAMICIN SULFATE 40 MG/ML IJ SOLN
400.0000 mg | INTRAVENOUS | Status: AC
  Administered 2017-09-08: 400 mg via INTRAVENOUS
  Filled 2017-09-07: qty 10

## 2017-09-07 NOTE — H&P (Signed)
The patient felt better after the hydrodistention but still gets the shooting pain up to the umbilicus and down towards the supra pubic area and symphysis pubis. She even has back pain. I do not think she is clinically infected but I sent her urine for culture   She still has the blood with wiping   I spent a lot of time with urine drew a picture. I talked about interstitial cystitis in detail and in my opinion it is the cause of her abdominal complaints and aggravates her frequency and certainly is a significant Cause of her significant dyspareunia avoiding intercourse.   I drew her a picture and talked about the exposed mesh. We talked about pros cons and risks of surgery. We talked about approximately a 95% success rate and failure rates with recurrence rates. We talked about injury to other structures and sequelae including fistula. In my opinion this will not correct any of her symptoms including dyspareunia and pain. Only rarely would help the symptoms with her significant interstitial cystitis.   The patient was given an IC diet. We might do physical therapy after the mesh is removed. I gave her Mobic 15 mg a day with 30 tablets and 11 refills. She does not think she could tolerate rescue treatments and I think she might be correct so they were not given. Elmiron prescribed. the patient was given 20 hydrocodone but was told that I do not give long-term narcotic pain medications for interstitial cystitis   She is a young woman with ongoing spotting and I think removal of the sling i.e. part of it is prudent. She agreed. I told her about a lot of the sling would be left in situ but the chance of her leaking with coughing and sneezing to [place] afterwards may be 5% and the% could change depending on how much sling was removed. Again I emphasize injury to other structures. she understands that generally I would just remove the local exposed area but the pending upon the intraoperative findings remove  more mesh. The incidence of mesh extrusion also discussed. 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 Has not smoked since 11/23/2016. Smoked for 20 years. Smoked 1/2 pack per day.   Tobacco Use Assessment Completed: Used Tobacco in last 30 days? Drinks 1 drink per day.  Drinks 4+ caffeinated drinks per day.    REVIEW OF SYSTEMS:    GU Review Female:   Patient reports get up at night to urinate. Patient denies leakage of urine, stream starts and stops, hard to postpone urination, trouble starting your stream, frequent urination, being pregnant, burning /pain with urination, and have to strain to urinate.  Gastrointestinal (Upper):   Patient denies nausea, vomiting, and indigestion/ heartburn.  Gastrointestinal (Lower):   Patient denies diarrhea and constipation.  Constitutional:   Patient reports fatigue. Patient denies fever, night sweats, and  weight loss.  Skin:   Patient denies skin rash/ lesion and itching.  Eyes:   Patient denies blurred vision and double vision.  Ears/ Nose/ Throat:   Patient denies sore throat and sinus problems.  Hematologic/Lymphatic:   Patient denies swollen glands and easy bruising.  Cardiovascular:   Patient denies leg swelling and chest pains.  Respiratory:   Patient denies cough and shortness of breath.  Endocrine:    Patient denies excessive thirst.  Musculoskeletal:   Patient reports back pain. Patient denies joint pain.  Neurological:   Patient denies headaches and dizziness.  Psychologic:   Patient denies depression and anxiety.   VITAL SIGNS:      08/06/2017 03:30 PM  BP 132/83 mmHg  Pulse 85 /min  Temperature 98.1 F / 36.7 C   PAST DATA REVIEWED:  Source Of History:  Patient   PROCEDURES:          Urinalysis w/Scope Dipstick Dipstick Cont'd Micro  Color: Yellow Bilirubin: Neg WBC/hpf: 10 - 20/hpf  Appearance: Cloudy Ketones: Neg RBC/hpf: 3 - 10/hpf  Specific Gravity: 1.015 Blood: 3+ Bacteria: Mod (26-50/hpf)  pH: 8.0 Protein: Trace Cystals: NS (Not Seen)  Glucose: Neg Urobilinogen: 0.2 Casts: NS (Not Seen)    Nitrites: Neg Trichomonas: Not Present    Leukocyte Esterase: 2+ Mucous: Not Present      Epithelial Cells: 0 - 5/hpf      Yeast: NS (Not Seen)      Sperm: Not Present    ASSESSMENT:      ICD-10 Details  1 GU:   Gross hematuria - R31.0   2   Chronic cystitis (with hematuria) - N30.21               Notes:   I drew her a picture and we talked about reconstructive surgery in detail. Pros, cons, general surgical and anesthetic risks, and other options including watchful waiting were discussed. She understands that surgery is successful in approximately 85% of cases. Failure rates and the risk of persistence/recurrence/and worsening of the problem were discussed. Surgical risks were described but not limited to the discussion of injury to neighboring structures including the bowel (with possible life-threatening sepsis and colostomy), bladder, urethra, vagina (all resulting in further surgery), and ureter (resulting in re-implantation). We talked about injury to nerves/soft tissue leading to debilitating and intractable pelvic, abdominal, and lower extremity pain syndromes and neuropathies. The risks of dyspareunia, vaginal narrowing and shortening with sequelae were discussed. The risk  of persistent, de novo, or worsening incontinence/dysfunction was discussed. Bleeding risks, transfusion rates, and infection were discussed. The usual post-operative course was described. The patient understands that she might not reach her treatment goal and that she might be worse following surgery.    PLAN:            Medications New Meds: Elmiron 100 mg capsule 2 capsule PO BID   #120  11 Refill(s)  Hydrocodone-Acetaminophen 5 mg-325 mg tablet 1 tablet PO Q 6 H PRN   #20  0 Refill(s)  Mobic 15 mg tablet 1 tablet PO Daily   #30  11 Refill(s)    After a thorough review of the management options for the patient's condition the patient  elected to proceed with surgical therapy as noted above. We have discussed the potential benefits and risks of the procedure, side effects of the proposed treatment, the likelihood of the patient achieving the goals of the procedure, and any potential problems that might occur during the procedure  or recuperation. Informed consent has been obtained.

## 2017-09-07 NOTE — Progress Notes (Signed)
Final ekg in epic 

## 2017-09-07 NOTE — Anesthesia Preprocedure Evaluation (Addendum)
Anesthesia Evaluation  Patient identified by MRN, date of birth, ID band Patient awake    Reviewed: Allergy & Precautions, H&P , NPO status , Patient's Chart, lab work & pertinent test results, reviewed documented beta blocker date and time   History of Anesthesia Complications (+) DIFFICULT AIRWAY and history of anesthetic complications  Airway Mallampati: III   Neck ROM: Full    Dental  (+) Missing, Dental Advisory Given   Pulmonary neg COPD, former smoker,    Pulmonary exam normal breath sounds clear to auscultation       Cardiovascular hypertension, Pt. on medications and Pt. on home beta blockers (-) Past MI Normal cardiovascular exam Rhythm:Regular Rate:Normal     Neuro/Psych  Headaches, Insomnia   GI/Hepatic Neg liver ROS, Chronic Diarrhea   Endo/Other  neg diabetesMorbid obesity  Renal/GU negative Renal ROS  negative genitourinary   Musculoskeletal negative musculoskeletal ROS (+)   Abdominal Normal abdominal exam  (+)   Peds  Hematology negative hematology ROS (+)   Anesthesia Other Findings   Reproductive/Obstetrics                            Anesthesia Physical  Anesthesia Plan  ASA: III  Anesthesia Plan: General   Post-op Pain Management:    Induction: Intravenous  PONV Risk Score and Plan: 3 and Midazolam, Ondansetron, Dexamethasone and Treatment may vary due to age or medical condition  Airway Management Planned: Oral ETT and Video Laryngoscope Planned  Additional Equipment: None  Intra-op Plan:   Post-operative Plan: Extubation in OR  Informed Consent: I have reviewed the patients History and Physical, chart, labs and discussed the procedure including the risks, benefits and alternatives for the proposed anesthesia with the patient or authorized representative who has indicated his/her understanding and acceptance.   Dental advisory given  Plan Discussed  with: CRNA  Anesthesia Plan Comments: (2013 - Documented Mac 3 grade 2 view. Patient states that for surgery at another facility in 2018, they had difficulty.)       Anesthesia Quick Evaluation

## 2017-09-08 ENCOUNTER — Other Ambulatory Visit: Payer: Self-pay

## 2017-09-08 ENCOUNTER — Encounter (HOSPITAL_COMMUNITY): Payer: Self-pay | Admitting: *Deleted

## 2017-09-08 ENCOUNTER — Encounter (HOSPITAL_COMMUNITY)
Admission: RE | Disposition: A | Discharge status: Home / Self Care | Payer: Self-pay | Source: Ambulatory Visit | Attending: Urology

## 2017-09-08 ENCOUNTER — Ambulatory Visit (HOSPITAL_COMMUNITY): Payer: 59 | Admitting: Anesthesiology

## 2017-09-08 ENCOUNTER — Ambulatory Visit (HOSPITAL_COMMUNITY)
Admission: RE | Admit: 2017-09-08 | Discharge: 2017-09-08 | Disposition: A | Discharge status: Home / Self Care | Payer: 59 | Source: Ambulatory Visit | Attending: Urology | Admitting: Urology

## 2017-09-08 DIAGNOSIS — I10 Essential (primary) hypertension: Secondary | ICD-10-CM | POA: Diagnosis not present

## 2017-09-08 DIAGNOSIS — Z6841 Body Mass Index (BMI) 40.0 and over, adult: Secondary | ICD-10-CM | POA: Diagnosis not present

## 2017-09-08 DIAGNOSIS — Z791 Long term (current) use of non-steroidal anti-inflammatories (NSAID): Secondary | ICD-10-CM | POA: Diagnosis not present

## 2017-09-08 DIAGNOSIS — G4700 Insomnia, unspecified: Secondary | ICD-10-CM | POA: Diagnosis not present

## 2017-09-08 DIAGNOSIS — Y762 Prosthetic and other implants, materials and accessory obstetric and gynecological devices associated with adverse incidents: Secondary | ICD-10-CM | POA: Insufficient documentation

## 2017-09-08 DIAGNOSIS — Z87891 Personal history of nicotine dependence: Secondary | ICD-10-CM | POA: Insufficient documentation

## 2017-09-08 DIAGNOSIS — T83721A Exposure of implanted vaginal mesh and other prosthetic materials into vagina, initial encounter: Secondary | ICD-10-CM | POA: Diagnosis not present

## 2017-09-08 DIAGNOSIS — Z9889 Other specified postprocedural states: Secondary | ICD-10-CM | POA: Diagnosis not present

## 2017-09-08 DIAGNOSIS — N8110 Cystocele, unspecified: Secondary | ICD-10-CM | POA: Insufficient documentation

## 2017-09-08 DIAGNOSIS — Z9049 Acquired absence of other specified parts of digestive tract: Secondary | ICD-10-CM | POA: Insufficient documentation

## 2017-09-08 DIAGNOSIS — Y848 Other medical procedures as the cause of abnormal reaction of the patient, or of later complication, without mention of misadventure at the time of the procedure: Secondary | ICD-10-CM | POA: Diagnosis not present

## 2017-09-08 DIAGNOSIS — N3021 Other chronic cystitis with hematuria: Secondary | ICD-10-CM | POA: Insufficient documentation

## 2017-09-08 HISTORY — PX: EXCISION OF MESH: SHX6268

## 2017-09-08 SURGERY — REMOVAL, MESH, ABDOMEN OR PELVIS
Anesthesia: General | Site: Vagina

## 2017-09-08 MED ORDER — HYDROCODONE-ACETAMINOPHEN 5-325 MG PO TABS: 1.0000 | ORAL_TABLET | Freq: Four times a day (QID) | ORAL | 0 refills | Status: DC | PRN

## 2017-09-08 MED ORDER — CEPHALEXIN 500 MG PO CAPS: 500.0000 mg | ORAL_CAPSULE | Freq: Three times a day (TID) | ORAL | 0 refills | Status: AC

## 2017-09-08 MED ORDER — LIDOCAINE 2% (20 MG/ML) 5 ML SYRINGE
INTRAMUSCULAR | Status: AC
  Filled 2017-09-08: qty 5

## 2017-09-08 MED ORDER — FENTANYL CITRATE (PF) 100 MCG/2ML IJ SOLN
INTRAMUSCULAR | Status: AC
  Filled 2017-09-08: qty 2

## 2017-09-08 MED ORDER — PROMETHAZINE HCL 25 MG/ML IJ SOLN: 6.2500 mg | INTRAMUSCULAR | Status: DC | PRN

## 2017-09-08 MED ORDER — SUGAMMADEX SODIUM 200 MG/2ML IV SOLN
INTRAVENOUS | Status: AC
  Filled 2017-09-08: qty 2

## 2017-09-08 MED ORDER — ONDANSETRON HCL 4 MG/2ML IJ SOLN
INTRAMUSCULAR | Status: AC
  Filled 2017-09-08: qty 2

## 2017-09-08 MED ORDER — 0.9 % SODIUM CHLORIDE (POUR BTL) OPTIME
TOPICAL | Status: DC | PRN
  Administered 2017-09-08: 1000 mL

## 2017-09-08 MED ORDER — SUCCINYLCHOLINE CHLORIDE 200 MG/10ML IV SOSY
PREFILLED_SYRINGE | INTRAVENOUS | Status: DC | PRN
  Administered 2017-09-08: 120 mg via INTRAVENOUS

## 2017-09-08 MED ORDER — SUCCINYLCHOLINE CHLORIDE 200 MG/10ML IV SOSY
PREFILLED_SYRINGE | INTRAVENOUS | Status: AC
  Filled 2017-09-08: qty 10

## 2017-09-08 MED ORDER — POLYMYXIN B SULFATE 500000 UNITS IJ SOLR
INTRAMUSCULAR | Status: AC
  Filled 2017-09-08: qty 500000

## 2017-09-08 MED ORDER — EPHEDRINE 5 MG/ML INJ
INTRAVENOUS | Status: AC
  Filled 2017-09-08: qty 10

## 2017-09-08 MED ORDER — FENTANYL CITRATE (PF) 100 MCG/2ML IJ SOLN
INTRAMUSCULAR | Status: DC | PRN
  Administered 2017-09-08 (×4): 50 ug via INTRAVENOUS

## 2017-09-08 MED ORDER — HYDROCODONE-ACETAMINOPHEN 5-325 MG PO TABS
1.0000 | ORAL_TABLET | Freq: Four times a day (QID) | ORAL | Status: DC | PRN
  Administered 2017-09-08: 1 via ORAL

## 2017-09-08 MED ORDER — ROCURONIUM BROMIDE 50 MG/5ML IV SOSY
PREFILLED_SYRINGE | INTRAVENOUS | Status: AC
  Filled 2017-09-08: qty 5

## 2017-09-08 MED ORDER — ROCURONIUM BROMIDE 10 MG/ML (PF) SYRINGE
PREFILLED_SYRINGE | INTRAVENOUS | Status: DC | PRN
  Administered 2017-09-08: 50 mg via INTRAVENOUS
  Administered 2017-09-08: 10 mg via INTRAVENOUS

## 2017-09-08 MED ORDER — HYDROCODONE-ACETAMINOPHEN 5-325 MG PO TABS
ORAL_TABLET | ORAL | Status: AC
  Filled 2017-09-08: qty 1

## 2017-09-08 MED ORDER — STERILE WATER FOR IRRIGATION IR SOLN
Status: DC | PRN
  Administered 2017-09-08: 10 mL

## 2017-09-08 MED ORDER — INDIGOTINDISULFONATE SODIUM 8 MG/ML IJ SOLN
INTRAMUSCULAR | Status: AC
  Filled 2017-09-08: qty 5

## 2017-09-08 MED ORDER — EPHEDRINE SULFATE-NACL 50-0.9 MG/10ML-% IV SOSY
PREFILLED_SYRINGE | INTRAVENOUS | Status: DC | PRN
  Administered 2017-09-08: 5 mg via INTRAVENOUS

## 2017-09-08 MED ORDER — PROPOFOL 10 MG/ML IV BOLUS
INTRAVENOUS | Status: DC | PRN
  Administered 2017-09-08: 200 mg via INTRAVENOUS

## 2017-09-08 MED ORDER — LIDOCAINE-EPINEPHRINE (PF) 1 %-1:200000 IJ SOLN
INTRAMUSCULAR | Status: AC
  Filled 2017-09-08: qty 30

## 2017-09-08 MED ORDER — ESTRADIOL 0.1 MG/GM VA CREA
TOPICAL_CREAM | VAGINAL | Status: DC | PRN
  Administered 2017-09-08: 1 via VAGINAL

## 2017-09-08 MED ORDER — ONDANSETRON HCL 4 MG/2ML IJ SOLN
INTRAMUSCULAR | Status: DC | PRN
  Administered 2017-09-08: 4 mg via INTRAVENOUS

## 2017-09-08 MED ORDER — FENTANYL CITRATE (PF) 100 MCG/2ML IJ SOLN
25.0000 ug | INTRAMUSCULAR | Status: DC | PRN
  Administered 2017-09-08 (×3): 50 ug via INTRAVENOUS

## 2017-09-08 MED ORDER — PROPOFOL 10 MG/ML IV BOLUS
INTRAVENOUS | Status: AC
  Filled 2017-09-08: qty 20

## 2017-09-08 MED ORDER — MIDAZOLAM HCL 5 MG/5ML IJ SOLN
INTRAMUSCULAR | Status: DC | PRN
  Administered 2017-09-08 (×2): 2 mg via INTRAVENOUS

## 2017-09-08 MED ORDER — SUGAMMADEX SODIUM 200 MG/2ML IV SOLN
INTRAVENOUS | Status: DC | PRN
  Administered 2017-09-08: 200 mg via INTRAVENOUS

## 2017-09-08 MED ORDER — DEXAMETHASONE SODIUM PHOSPHATE 10 MG/ML IJ SOLN
INTRAMUSCULAR | Status: DC | PRN
  Administered 2017-09-08: 10 mg via INTRAVENOUS

## 2017-09-08 MED ORDER — MIDAZOLAM HCL 2 MG/2ML IJ SOLN
INTRAMUSCULAR | Status: AC
  Filled 2017-09-08: qty 2

## 2017-09-08 MED ORDER — ESTRADIOL 0.1 MG/GM VA CREA
TOPICAL_CREAM | VAGINAL | Status: AC
  Filled 2017-09-08: qty 42.5

## 2017-09-08 MED ORDER — DEXAMETHASONE SODIUM PHOSPHATE 10 MG/ML IJ SOLN
INTRAMUSCULAR | Status: AC
  Filled 2017-09-08: qty 1

## 2017-09-08 MED ORDER — SODIUM CHLORIDE 0.9 % IV SOLN
INTRAVENOUS | Status: DC | PRN
  Administered 2017-09-08: 500 mL

## 2017-09-08 MED ORDER — LIDOCAINE 2% (20 MG/ML) 5 ML SYRINGE
INTRAMUSCULAR | Status: DC | PRN
  Administered 2017-09-08: 100 mg via INTRAVENOUS

## 2017-09-08 MED ORDER — SODIUM CHLORIDE 0.9 % IV SOLN
1.5000 g | INTRAVENOUS | Status: AC
  Administered 2017-09-08: 1.5 g via INTRAVENOUS
  Filled 2017-09-08: qty 1.5

## 2017-09-08 MED ORDER — LACTATED RINGERS IV SOLN
INTRAVENOUS | Status: DC | PRN
  Administered 2017-09-08 (×2): via INTRAVENOUS

## 2017-09-08 MED ORDER — OXYCODONE HCL 5 MG PO TABS: 5.0000 mg | ORAL_TABLET | Freq: Once | ORAL | Status: DC | PRN

## 2017-09-08 MED ORDER — FENTANYL CITRATE (PF) 100 MCG/2ML IJ SOLN
INTRAMUSCULAR | Status: AC
  Administered 2017-09-08: 50 ug via INTRAVENOUS
  Filled 2017-09-08: qty 4

## 2017-09-08 MED ORDER — OXYCODONE HCL 5 MG/5ML PO SOLN
5.0000 mg | Freq: Once | ORAL | Status: DC | PRN
  Filled 2017-09-08: qty 5

## 2017-09-08 SURGICAL SUPPLY — 48 items
BAG DECANTER FOR FLEXI CONT (MISCELLANEOUS) ×2 IMPLANT
BAG URINE DRAINAGE (UROLOGICAL SUPPLIES) IMPLANT
BLADE HEX COATED 2.75 (ELECTRODE) ×2 IMPLANT
BLADE SURG 15 STRL LF DISP TIS (BLADE) ×1 IMPLANT
BLADE SURG 15 STRL SS (BLADE) ×1
CATH FOLEY 2WAY SLVR  5CC 14FR (CATHETERS)
CATH FOLEY 2WAY SLVR 5CC 14FR (CATHETERS) IMPLANT
COVER MAYO STAND STRL (DRAPES) ×2 IMPLANT
DECANTER SPIKE VIAL GLASS SM (MISCELLANEOUS) ×2 IMPLANT
DERMABOND ADVANCED (GAUZE/BANDAGES/DRESSINGS)
DERMABOND ADVANCED .7 DNX12 (GAUZE/BANDAGES/DRESSINGS) IMPLANT
DRAIN PENROSE 18X1/4 LTX STRL (WOUND CARE) ×2 IMPLANT
DRAPE SHEET LG 3/4 BI-LAMINATE (DRAPES) ×2 IMPLANT
ELECT PENCIL ROCKER SW 15FT (MISCELLANEOUS) ×2 IMPLANT
GAUZE PACKING 2X5 YD STRL (GAUZE/BANDAGES/DRESSINGS) ×2 IMPLANT
GAUZE SPONGE 4X4 16PLY XRAY LF (GAUZE/BANDAGES/DRESSINGS) ×4 IMPLANT
GLOVE BIOGEL M STRL SZ7.5 (GLOVE) ×6 IMPLANT
GLOVE ECLIPSE 8.0 STRL XLNG CF (GLOVE) ×4 IMPLANT
GOWN STRL REUS W/TWL XL LVL3 (GOWN DISPOSABLE) ×2 IMPLANT
HOLDER FOLEY CATH W/STRAP (MISCELLANEOUS) IMPLANT
KIT BASIN OR (CUSTOM PROCEDURE TRAY) ×2 IMPLANT
NEEDLE HYPO 22GX1.5 SAFETY (NEEDLE) ×2 IMPLANT
NEEDLE MAYO 6 CRC TAPER PT (NEEDLE) IMPLANT
PACK CYSTO (CUSTOM PROCEDURE TRAY) ×2 IMPLANT
PLUG CATH AND CAP STER (CATHETERS) ×2 IMPLANT
RETRACTOR STAY HOOK 5MM (MISCELLANEOUS) ×2 IMPLANT
SHEET LAVH (DRAPES) ×2 IMPLANT
SUT CAPIO ETHIBPND (SUTURE) IMPLANT
SUT CAPIO POLYGLYCOLIC (SUTURE) IMPLANT
SUT ETHIBOND 0 (SUTURE) IMPLANT
SUT SILK 2 0 SH (SUTURE) IMPLANT
SUT SILK 4 0 SH CR/8 (SUTURE) ×2 IMPLANT
SUT VIC AB 0 CT1 27 (SUTURE) ×1
SUT VIC AB 0 CT1 27XBRD ANTBC (SUTURE) ×1 IMPLANT
SUT VIC AB 2-0 CT1 27 (SUTURE) ×1
SUT VIC AB 2-0 CT1 27XBRD (SUTURE) ×1 IMPLANT
SUT VIC AB 2-0 SH 27 (SUTURE) ×1
SUT VIC AB 2-0 SH 27X BRD (SUTURE) ×1 IMPLANT
SUT VIC AB 3-0 SH 27 (SUTURE) ×1
SUT VIC AB 3-0 SH 27XBRD (SUTURE) ×1 IMPLANT
SUT VIC AB 4-0 PS2 27 (SUTURE) IMPLANT
SUT VICRYL 0 UR6 27IN ABS (SUTURE) IMPLANT
SYR 10ML LL (SYRINGE) IMPLANT
TOWEL NATURAL 10PK STERILE (DISPOSABLE) ×2 IMPLANT
TOWEL OR NON WOVEN STRL DISP B (DISPOSABLE) ×2 IMPLANT
TUBING CONNECTING 10 (TUBING) ×2 IMPLANT
WATER STERILE IRR 1000ML POUR (IV SOLUTION) IMPLANT
YANKAUER SUCT BULB TIP 10FT TU (MISCELLANEOUS) ×2 IMPLANT

## 2017-09-08 NOTE — Op Note (Signed)
Preoperative diagnosis: Exposed vaginal wall mesh Postoperative diagnosis: Exposed vaginal wall mesh Surgery: Removal of exposed vaginal wall mesh and cystoscopy Surgeon: Dr. Lorin PicketScott Tyquarius Paglia Assistant: Harrie ForemanAmanda Dancy  The assistant was present and necessary for all steps of the operation described. The assistant played a critical role assisting during the operation  The patient was prepped and draped in usual fashion.  Extra care was taken with leg positioning to minimize the risk of compartment syndrome and neuropathy and deep vein thrombosis.  I took a lot of time with my retractors to expose the vagina and urethrovesical angle.  Upon doing this she had approximately a 1 cm x 4 mm area of exposure of mesh at the left urethrovesical angle.  She had a grade 1 cystocele.  The tissues surrounding the mesh throughout the vagina were visually and palpably normal.  She had folds previously noted but does not have a diverticulum.  There is no obvious cellulitis or vaginitis  With excellent exposure and retraction I made a small elliptical incision around the mesh.  I develop the flap circumferentially.  It was quite easy to pass a small right angle around it.  I decided to trace some of the mesh more medially.  With appropriate retraction the mesh was quite close to the vaginal wall mucosa medially and I extended the medial incision for about another centimeter to approximately the mid urethra away from the urethra.  At that area I decided that I had exposed enough mesh and that I would not need to remove more.  The mesh was cut at that level of approximately the mid urethra.  It was sharply and bluntly dissected up to the pelvic sidewall on her left side.  It was amputated at that level.  Palpably and visually I was very happy with removal of the left 50% of the mesh from just left of mid urethra to the left pelvic sidewall.  I inspected the bed very well and tissues looked very healthy.  A lot of irrigation  was utilized.  I closed the area with a running 3-0 Vicryl on an SH needle followed by 3 interrupted sutures.  The patient was cystoscope.  Bladder mucosa and trigone were normal.  There is no bladder injury.  Urethra was normal.  There is excellent efflux bilaterally  Vaginal pack was inserted.  Patient was taken to recovery.  Hopefully this will help reach the patient's treatment goal

## 2017-09-08 NOTE — Discharge Instructions (Signed)
General Anesthesia, Adult, Care After These instructions provide you with information about caring for yourself after your procedure. Your health care provider may also give you more specific instructions. Your treatment has been planned according to current medical practices, but problems sometimes occur. Call your health care provider if you have any problems or questions after your procedure. What can I expect after the procedure? After the procedure, it is common to have:  Vomiting.  A sore throat.  Mental slowness.  It is common to feel:  Nauseous.  Cold or shivery.  Sleepy.  Tired.  Sore or achy, even in parts of your body where you did not have surgery.  Follow these instructions at home: For at least 24 hours after the procedure:  Do not: ? Participate in activities where you could fall or become injured. ? Drive. ? Use heavy machinery. ? Drink alcohol. ? Take sleeping pills or medicines that cause drowsiness. ? Make important decisions or sign legal documents. ? Take care of children on your own.  Rest. Eating and drinking  If you vomit, drink water, juice, or soup when you can drink without vomiting.  Drink enough fluid to keep your urine clear or pale yellow.  Make sure you have little or no nausea before eating solid foods.  Follow the diet recommended by your health care provider. General instructions  Have a responsible adult stay with you until you are awake and alert.  Return to your normal activities as told by your health care provider. Ask your health care provider what activities are safe for you.  Take over-the-counter and prescription medicines only as told by your health care provider.  If you smoke, do not smoke without supervision.  Keep all follow-up visits as told by your health care provider. This is important. Contact a health care provider if:  You continue to have nausea or vomiting at home, and medicines are not helpful.  You  cannot drink fluids or start eating again.  You cannot urinate after 8-12 hours.  You develop a skin rash.  You have fever.  You have increasing redness at the site of your procedure. Get help right away if:  You have difficulty breathing.  You have chest pain.  You have unexpected bleeding.  You feel that you are having a life-threatening or urgent problem. This information is not intended to replace advice given to you by your health care provider. Make sure you discuss any questions you have with your health care provider. Document Released: 11/17/2000 Document Revised: 01/14/2016 Document Reviewed: 07/26/2015 Elsevier Interactive Patient Education  2018 [employer]  Urethral Vaginal Mesh Removal, Care After This sheet gives you information about how to care for yourself after your procedure. Your health care provider may also give you more specific instructions. If you have problems or questions, contact your health care provider. What can I expect after the procedure? After the procedure, it is common to have:  Pain or discomfort at the incision site, and around your urethral opening. Your urethra is the part of your body that drains urine from your bladder.  Some fluid (drainage) coming from your vagina. Fluid may become brownish and then a light cream color. Drainage may continue for up to 2 months.  Problems passing urine, such as not being able to start urinating or feeling like you cannot empty your bladder completely.  Follow these instructions at home: Medicines  If you were prescribed an antibiotic medicine, take it as told by your health  care provider. Do not stop taking the antibiotic even if you start to feel better.  Take over-the-counter and prescription medicines only as told by your health care provider.  If you were prescribed estrogen cream medicine, apply it as told by your health care provider. Activity  Do not lift anything that is heavier than  10 lb (4.5 kg), or the limit that you are told, until your health care provider says that it is safe.  Return to your normal activities as told by your health care provider. Ask your health care provider what activities are safe for you.  Do not have sex or put anything in your vagina until your health care provider says that this is safe. Driving  Do not drive or use heavy machinery while taking prescription pain medicine.  Do not drive for 24 hours if you were given a medicine to help you relax (sedative) during your procedure. General instructions  Do not take baths, swim, or use a hot tub until your health care provider approves. Ask your health care provider if you may take showers. You may only be allowed to take sponge baths.  Do not use any products that contain nicotine or tobacco, such as cigarettes and e-cigarettes. If you need help quitting, ask your health care provider.  To prevent or treat constipation while you are taking prescription pain medicine, your health care provider may recommend that you: ? Drink enough fluid to keep your urine pale yellow. ? Take over-the-counter or prescription medicines. ? Eat foods that are high in fiber, such as fresh fruits and vegetables, whole grains, and beans. ? Limit foods that are high in fat and processed sugars, such as fried and sweet foods.  Keep all follow-up visits as told by your health care provider. This is important. Contact a health care provider if:  You have a fever.  You have swelling or more pain around your incision.  You have fluid or blood coming from your incision.  You have pus or a bad smell coming from your incision or your vagina.  You have more drainage from your vagina.  You cannot eat or drink without vomiting.  You continue to have problems passing urine. Get help right away if:  You have trouble breathing.  You have chest pain.  You have severe pain in your pelvic area or abdomen, and it  does not get better with medicine. These symptoms may represent a serious problem that is an emergency. Do not wait to see if the symptoms will go away. Get medical help right away. Call your local emergency services (911 in the [place] Do not drive yourself to the hospital. Summary  After the procedure, it is common to have pain or discomfort at the incision site, some drainage from your vagina, and problems passing urine.  Do not have sex or put anything in your vagina until your health care provider says that this is safe.  Contact your health care provider if you have symptoms of infection or other symptoms that do not get better with treatment. This information is not intended to replace advice given to you by your health care provider. Make sure you discuss any questions you have with your health care provider. Document Released: 12/08/2016 Document Revised: 12/08/2016 Document Reviewed: 12/08/2016 Elsevier Interactive Patient Education  [employer].   I have reviewed discharge instructions in detail with the patient. They will follow-up with me or their physician as scheduled. My nurse will also be calling  the patients as per protocol.

## 2017-09-08 NOTE — Anesthesia Postprocedure Evaluation (Signed)
Anesthesia Post Note  Patient: Danielle Gould  Procedure(s) Performed: REMOVAL OF EXPOSED VAGINAL MESH (N/A Vagina )     Patient location during evaluation: PACU Anesthesia Type: General Level of consciousness: awake and alert Pain management: pain level controlled Vital Signs Assessment: post-procedure vital signs reviewed and stable Respiratory status: spontaneous breathing, nonlabored ventilation, respiratory function stable and patient connected to nasal cannula oxygen Cardiovascular status: blood pressure returned to baseline and stable Postop Assessment: no apparent nausea or vomiting Anesthetic complications: no    Last Vitals:  Vitals:   09/08/17 0958 09/08/17 1009  BP:  113/77  Pulse:  76  Resp:  14  Temp: 36.6 C 36.6 C  SpO2:  97%    Last Pain:  Vitals:   09/08/17 1009  TempSrc:   PainSc: 7                  Chaitanya Amedee S

## 2017-09-08 NOTE — Progress Notes (Signed)
Vaginal packing removed. Patient tolerated well. Will continue to monitor.

## 2017-09-08 NOTE — Transfer of Care (Signed)
Immediate Anesthesia Transfer of Care Note  Patient: Danielle Gould  Procedure(s) Performed: REMOVAL OF EXPOSED VAGINAL MESH (N/A Vagina )  Patient Location: PACU  Anesthesia Type:General  Level of Consciousness: sedated  Airway & Oxygen Therapy: Patient Spontanous Breathing and Patient connected to face mask oxygen  Post-op Assessment: Report given to RN and Post -op Vital signs reviewed and stable  Post vital signs: Reviewed and stable  Last Vitals:  Vitals:   09/08/17 0551  BP: (!) 134/93  Pulse: 80  Resp: 18  Temp: 36.6 C  SpO2: 99%    Last Pain:  Vitals:   09/08/17 0551  TempSrc: Oral      Patients Stated Pain Goal: 4 (09/08/17 0604)  Complications: No apparent anesthesia complications

## 2017-09-08 NOTE — Anesthesia Procedure Notes (Signed)
Procedure Name: Intubation Date/Time: 09/08/2017 7:38 AM Performed by: Beryle LatheBrock, Danielle Hibberd E, MD Pre-anesthesia Checklist: Patient identified, Emergency Drugs available, Suction available and Patient being monitored Patient Re-evaluated:Patient Re-evaluated prior to induction Oxygen Delivery Method: Circle system utilized Preoxygenation: Pre-oxygenation with 100% oxygen Induction Type: IV induction Ventilation: Mask ventilation without difficulty Laryngoscope Size: Miller and 3 Grade View: Grade II Tube type: Oral Tube size: 7.5 mm Number of attempts: 1 Airway Equipment and Method: Stylet and Oral airway Placement Confirmation: ETT inserted through vocal cords under direct vision,  positive ETCO2 and breath sounds checked- equal and bilateral Secured at: 22 cm Tube secured with: Tape Dental Injury: Teeth and Oropharynx as per pre-operative assessment  Difficulty Due To: Difficulty was anticipated Comments: Intubated with Mil 3, grade 2B view with and without cricoid pressure. Anterior airway, small oropharynx limiting space for blade and ETT (known difficult airway). Glidescope in the room on standby. Would recommend going straight to glidescope in the future.

## 2017-09-08 NOTE — Interval H&P Note (Signed)
History and Physical Interval Note:  09/08/2017 7:11 AM  Danielle Gould  has presented today for surgery, with the diagnosis of exposed vaginal mesh  The various methods of treatment have been discussed with the patient and family. After consideration of risks, benefits and other options for treatment, the patient has consented to  Procedure(s): REMOVAL OF EXPOSED VAGINAL MESH (N/A) as a surgical intervention .  The patient's history has been reviewed, patient examined, no change in status, stable for surgery.  I have reviewed the patient's chart and labs.  Questions were answered to the patient's satisfaction.     Jahdiel Krol A

## 2017-09-08 NOTE — Progress Notes (Signed)
AMANDA DANCY PA at bedside, instructs RN that packing should remain in place at least 2 hours.  Dr MAC Diarmid to talk to pt before discharge.

## 2017-09-08 NOTE — Progress Notes (Signed)
Dr. Sherron MondayMacDiarmid in patient room. Per MD okay to remove vaginal packing.

## 2017-09-09 ENCOUNTER — Encounter (HOSPITAL_COMMUNITY): Payer: Self-pay | Admitting: Urology

## 2017-10-02 ENCOUNTER — Other Ambulatory Visit: Payer: Self-pay | Admitting: Obstetrics and Gynecology

## 2017-10-02 DIAGNOSIS — Z1231 Encounter for screening mammogram for malignant neoplasm of breast: Secondary | ICD-10-CM

## 2017-10-21 ENCOUNTER — Ambulatory Visit
Admission: RE | Admit: 2017-10-21 | Discharge: 2017-10-21 | Disposition: A | Discharge status: Home / Self Care | Payer: 59 | Source: Ambulatory Visit | Attending: Obstetrics and Gynecology | Admitting: Obstetrics and Gynecology

## 2017-10-21 DIAGNOSIS — Z1231 Encounter for screening mammogram for malignant neoplasm of breast: Secondary | ICD-10-CM

## 2018-03-26 IMAGING — MG DIGITAL SCREENING BILATERAL MAMMOGRAM WITH TOMO AND CAD
8 series · 8 of 24 positions shown · non-contrast
Comparison: Previous exam(s).

CLINICAL DATA: Screening.

EXAM:
DIGITAL SCREENING BILATERAL MAMMOGRAM WITH TOMO AND CAD

[R CC synth-2D]
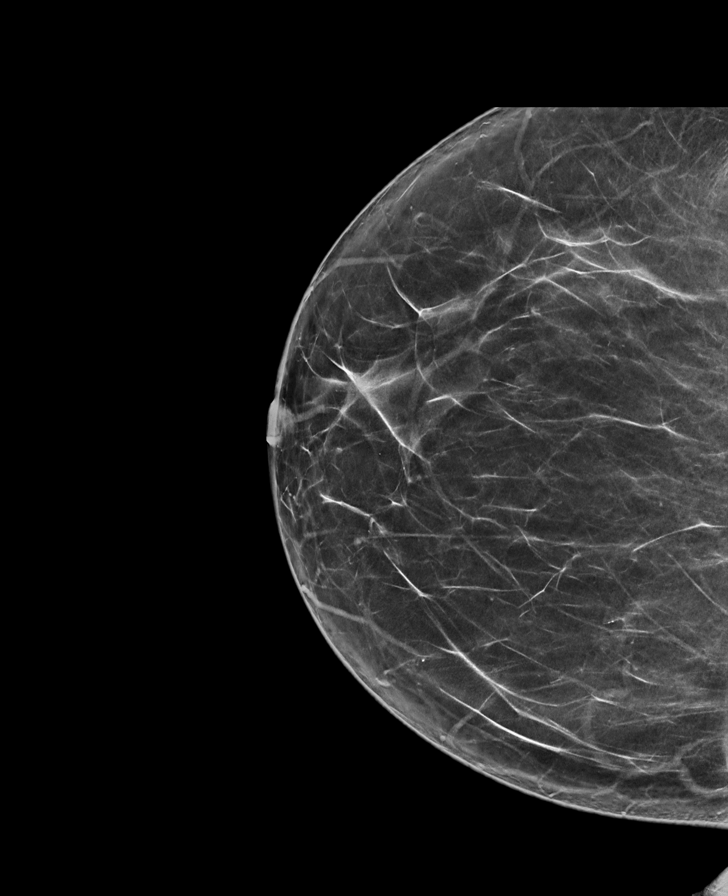

[R MLO synth-2D]
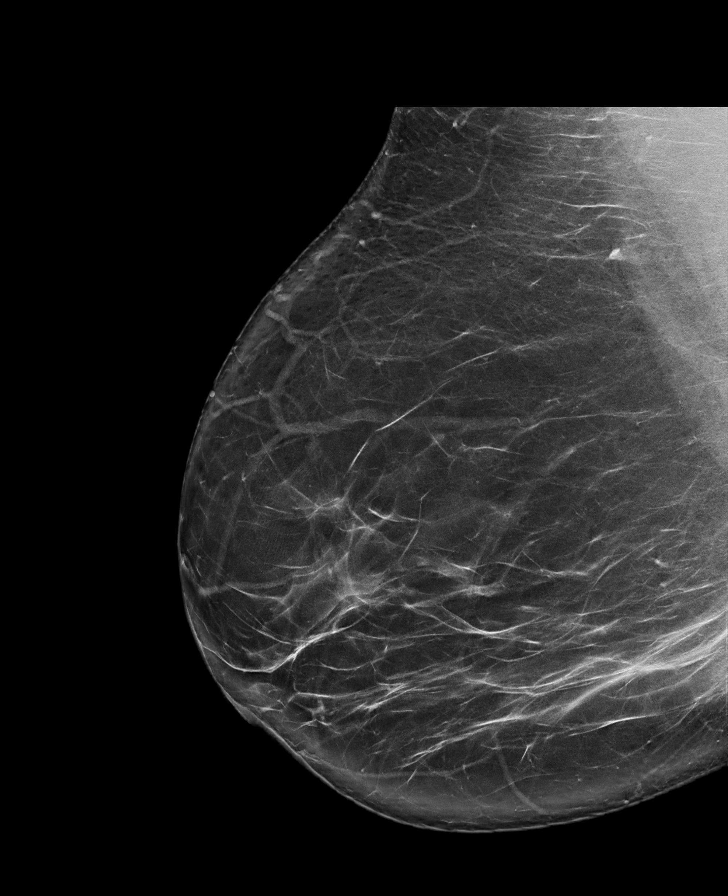

[L CC synth-2D]
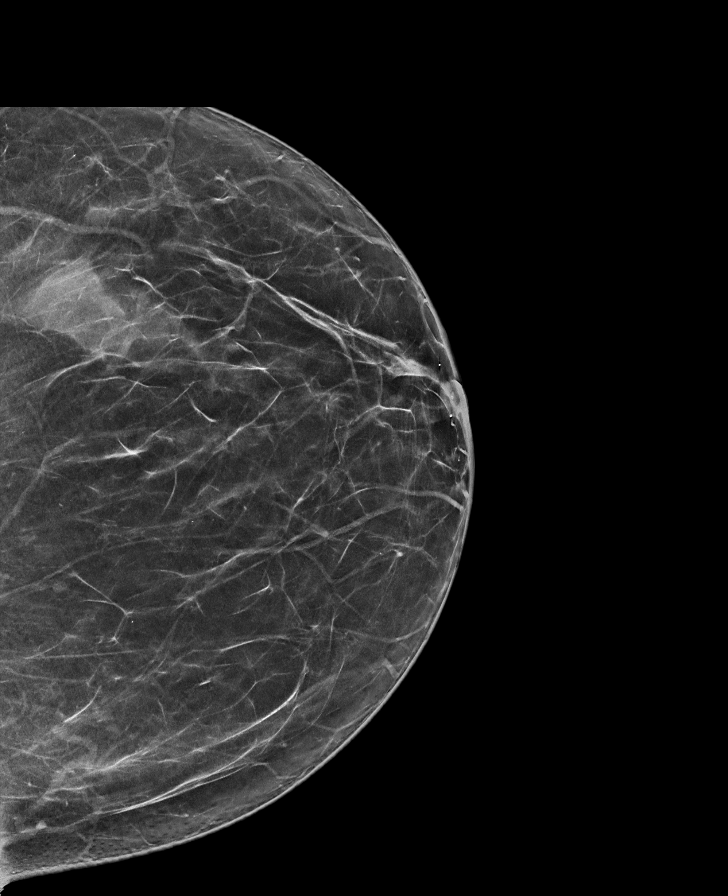

[L MLO synth-2D]
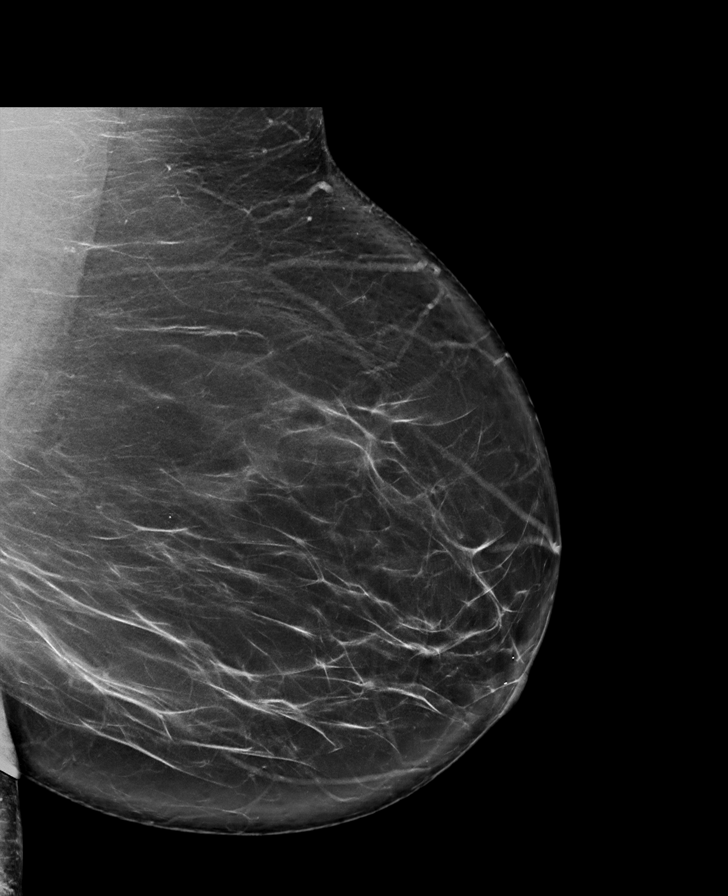

[R MLO tomo · tomo slice 51/100.0]
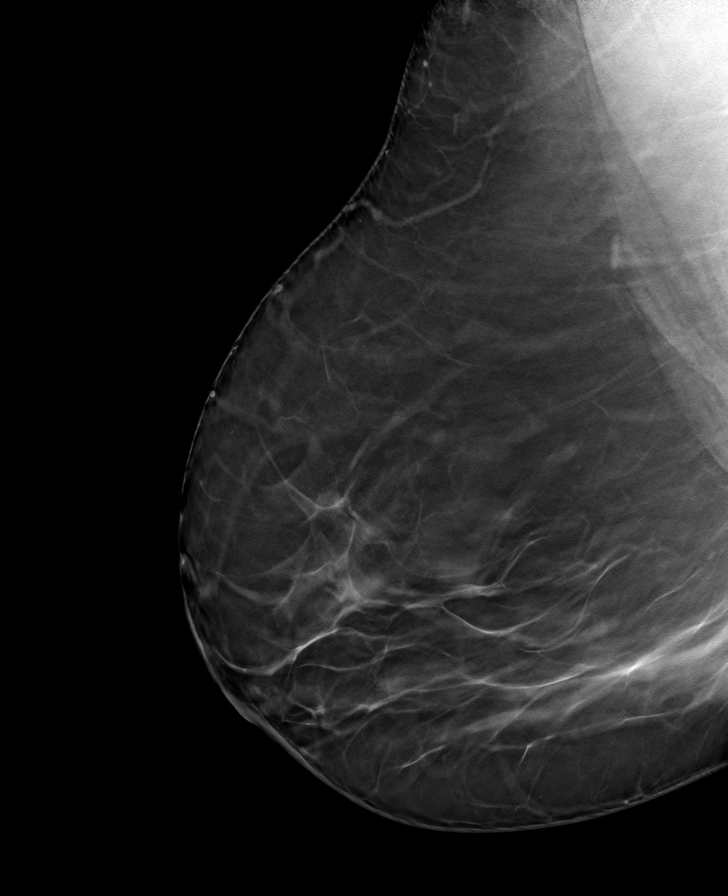

[L MLO tomo · tomo slice 51/101.0]
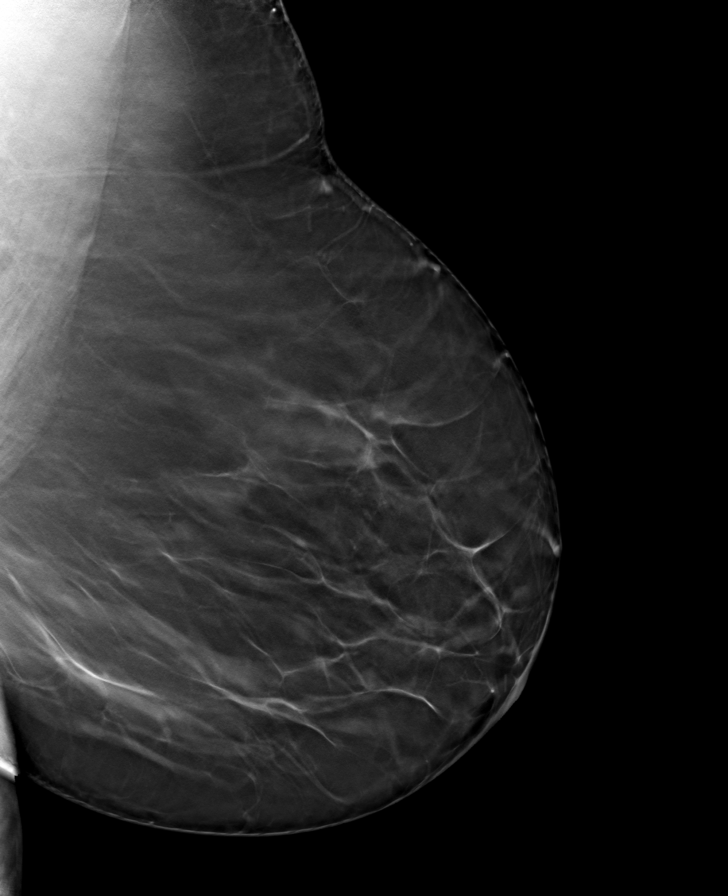

[L CC tomo · tomo slice 40/79.0]
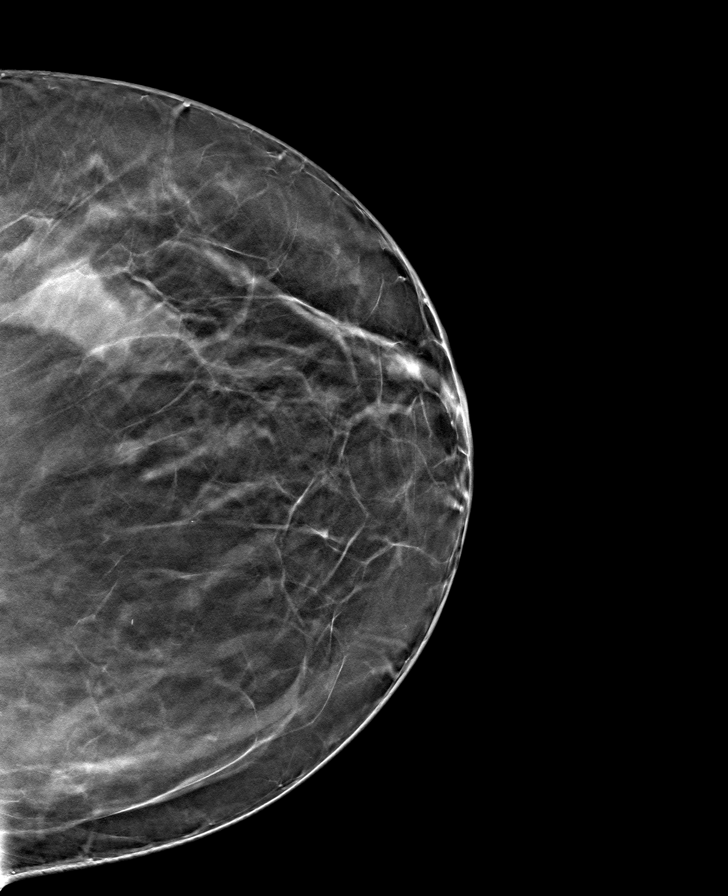

[R CC tomo · tomo slice 41/82.0]
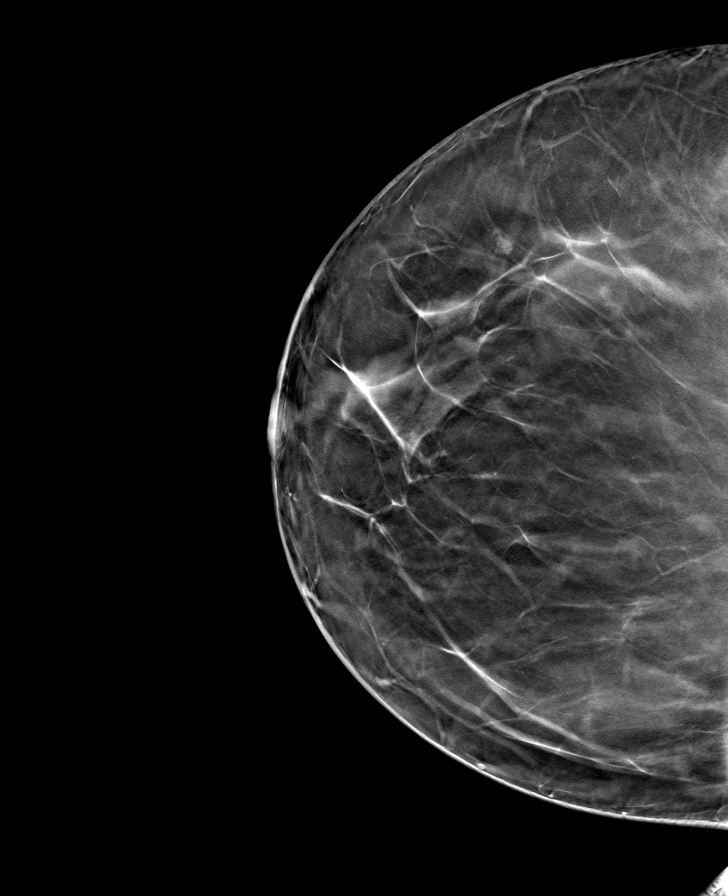

[8 of 24 positions shown; findings below may reference images not displayed]

ACR Breast Density Category b: There are scattered areas of
fibroglandular density.
FINDINGS: There are no findings suspicious for malignancy. Images were
processed with CAD.
IMPRESSION: No mammographic evidence of malignancy. A result letter of this
screening mammogram will be mailed directly to the patient.

RECOMMENDATION:
Screening mammogram in one year. (Code:[id number])

BI-RADS CATEGORY  1: Negative.

## 2018-04-02 ENCOUNTER — Other Ambulatory Visit: Payer: Self-pay | Admitting: Urology

## 2018-04-22 NOTE — Patient Instructions (Addendum)
Danielle Gould  04/22/2018   Your procedure is scheduled on: 04/27/2018   Report to [facility] Main  Entrance  Report to admitting at     0900 AM    Call this number if you have problems the morning of surgery [phone]   Remember: Do not eat food or drink liquids :After Midnight.     Take these medicines the morning of surgery with A SIP OF WATER:none                                  You may not have any metal on your body including hair pins and              piercings  Do not wear jewelry, make-up, lotions, powders or perfumes, deodorant             Do not wear nail polish.  Do not shave  48 hours prior to surgery.                 Do not bring valuables to the hospital. [facility] IS NOT             RESPONSIBLE   FOR VALUABLES.  Contacts, dentures or bridgework may not be worn into surgery.      Patients discharged the day of surgery will not be allowed to drive home.  Name and phone number of your driver:                Please read over the following fact sheets you were given: _____________________________________________________________________             [facility] - Preparing for Surgery Before surgery, you can play an important role.  Because skin is not sterile, your skin needs to be as free of germs as possible.  You can reduce the number of germs on your skin by washing with CHG (chlorahexidine gluconate) soap before surgery.  CHG is an antiseptic cleaner which kills germs and bonds with the skin to continue killing germs even after washing. Please DO NOT use if you have an allergy to CHG or antibacterial soaps.  If your skin becomes reddened/irritated stop using the CHG and inform your nurse when you arrive at Short Stay. Do not shave (including legs and underarms) for at least 48 hours prior to the first CHG shower.  You may shave your face/neck. Please follow these instructions carefully:  1.  Shower with CHG Soap the night before  surgery and the  morning of Surgery.  2.  If you choose to wash your hair, wash your hair first as usual with your  normal  shampoo.  3.  After you shampoo, rinse your hair and body thoroughly to remove the  shampoo.                           4.  Use CHG as you would any other liquid soap.  You can apply chg directly  to the skin and wash                       Gently with a scrungie or clean washcloth.  5.  Apply the CHG Soap to your body ONLY FROM THE NECK DOWN.   Do not use on face/  open                           Wound or open sores. Avoid contact with eyes, ears mouth and genitals (private parts).                       Wash face,  Genitals (private parts) with your normal soap.             6.  Wash thoroughly, paying special attention to the area where your surgery  will be performed.  7.  Thoroughly rinse your body with warm water from the neck down.  8.  DO NOT shower/wash with your normal soap after using and rinsing off  the CHG Soap.                9.  Pat yourself dry with a clean towel.            10.  Wear clean pajamas.            11.  Place clean sheets on your bed the night of your first shower and do not  sleep with pets. Day of Surgery : Do not apply any lotions/deodorants the morning of surgery.  Please wear clean clothes to the hospital/surgery center.  FAILURE TO FOLLOW THESE INSTRUCTIONS MAY RESULT IN THE CANCELLATION OF YOUR SURGERY PATIENT SIGNATURE_________________________________  NURSE SIGNATURE__________________________________  ________________________________________________________________________

## 2018-04-23 ENCOUNTER — Encounter (HOSPITAL_COMMUNITY)
Admission: RE | Admit: 2018-04-23 | Discharge: 2018-04-23 | Disposition: A | Discharge status: Home / Self Care | Payer: 59 | Source: Ambulatory Visit | Attending: Urology | Admitting: Urology

## 2018-04-23 ENCOUNTER — Encounter (HOSPITAL_COMMUNITY): Payer: Self-pay

## 2018-04-23 ENCOUNTER — Other Ambulatory Visit: Payer: Self-pay

## 2018-04-23 DIAGNOSIS — N3010 Interstitial cystitis (chronic) without hematuria: Secondary | ICD-10-CM | POA: Insufficient documentation

## 2018-04-23 DIAGNOSIS — Z01812 Encounter for preprocedural laboratory examination: Secondary | ICD-10-CM | POA: Diagnosis not present

## 2018-04-23 LAB — BASIC METABOLIC PANEL
Anion gap: 10 (ref 5–15)
BUN: 10 mg/dL (ref 6–20)
CHLORIDE: 104 mmol/L (ref 98–111)
CO2: 26 mmol/L (ref 22–32)
Calcium: 9.3 mg/dL (ref 8.9–10.3)
Creatinine, Ser: 0.85 mg/dL (ref 0.44–1.00)
GFR calc non Af Amer: >60 mL/min (ref >60)
Glucose, Bld: 93 mg/dL (ref 70–99)
POTASSIUM: 3.8 mmol/L (ref 3.5–5.1)
SODIUM: 140 mmol/L (ref 135–145)

## 2018-04-23 LAB — CBC
HEMATOCRIT: 43.5 % (ref 36.0–46.0)
Hemoglobin: 14.6 g/dL (ref 12.0–15.0)
MCH: 29 pg (ref 26.0–34.0)
MCHC: 33.6 g/dL (ref 30.0–36.0)
MCV: 86.5 fL (ref 78.0–100.0)
Platelets: 278 10*3/uL (ref 150–400)
RBC: 5.03 MIL/uL (ref 3.87–5.11)
RDW: 13.6 % (ref 11.5–15.5)
WBC: 10.1 10*3/uL (ref 4.0–10.5)

## 2018-04-23 LAB — HCG, SERUM, QUALITATIVE: Preg, Serum: NEGATIVE

## 2018-04-23 NOTE — Progress Notes (Signed)
110/2019- EKG- epic  

## 2018-04-23 NOTE — H&P (View-Only) (Signed)
110/2019- EKG- epic

## 2018-04-23 NOTE — H&P (Signed)
The patient had mesh removed about 2 weeks ago. She has some nonspecific low back pain. She has a little bit of burning around her umbilicus. She is having no vaginal complaints or bleeding   Once again based on anatomy and her nervousness and such she is harder to examine. no vaginitis. No discharge. Sutures intact.   the patient overall was doing better today. I felt her nonspecific left lower back pain worse in the morning was not from her genitourinary system but I sent her urine for culture   From time to time the patient has peri umbilical burning. She no longer has vaginal bleeding or discharge.   Once again she is difficult to examine based upon her nervousness and her anatomy well documented in the past. I could not feel or see any erythema or mesh extrusion. She says she is feeling a lot better but wonders in the last 2 days if she is having some vaginal itchiness or burning   We were not sure if she was having a yeast infection today so I gave her 3 days of Diflucan. I will call the urine culture is positive. She really believes that the Elmiron in the last 3 months is helping as did the surgery. I will see her again in 3 months. She has only had 4 episodes of discomfort otherwise is doing great   Patient today had a long list of complaints including abdominal and pelvic pain. She feels bumps and discomfort in the left lower quadrant. She did feel distended. She feels spasms in her vagina. For 9 days she had some vaginal spotting or blood. She no longer can have sex. She says she can throw up phlegm or bile in the morning. She has been very tired.She can have right abdominal swelling more than the left. The left lower quadrant is 1 of the areas in question and she has had this before.   Once again the pelvic examination was very difficult. She is tender. Body habitus and narrow or vaginal access make the exam also more limited. She has a lot of folds of tissue and I really could not see well  in the area of her sling. Near the junction of the anterior vaginal wall and left pelvic sidewall I might have felt a small foreign body but of course it could be sub epithelial   Her normal CT scan was in November 2018. she has so many complaints is not unreasonable to repeat the CT scan. It would be reasonable to perform another hydrodistention for her interstitial cystitis since that likely this was why some of her symptoms improved in the past. I could look for more mesh. I would examine her under anesthesia and consent to removal of any mesh if located.  I reviewed my operative note and I had removed the left 50% of her sling from approximately mid urethra to the pelvic sidewall. Worsening incontinence and many of her symptoms could persist in spite of 1 or both of these procedures. She may need to see Gynecology or her primary care.   Patient understands that her nausea and vomiting and many of her symptoms cannot be explained my opinion from interstitial cystitis or if she had any exposed mesh. She will get a CT scan discussed. She might see her primary physician. She thinks he likely will consent to a hydrodistention I think it helped in the past. She was doing so well and she takes her interstitial cystitis medication 2 tablets twice a  day. I would look for more mesh then   she was tender today when examined her so I gave her 8 hydrocodone. I will not be represcribing being these   Today  I reviewed medical records were a small area of sling mesh had been excised. Her CT scan was normal. Last urine culture negative. She had a positive hydrodistention for interstitial cystitis in the past.   Patient still has left lower quadrant symptoms, suprapubic pressure, vaginal spasms. The spitting up has only happened twice. She did not see her primary doctor. She cannot have intercourse.   She understands that she likely does not have mesh and she does it would not be related. I am hoping that the  Hydrodistention will help like it did last time. pros cons risks reviewed. She remembers our discussions last time. Recognizing time issues we decided to schedule it as if she had mesh but she knows she will only be asleep for about 10 minutes if she does not have any mesh noted. Clinically not infected today and frequency stable     ALLERGIES: None   MEDICATIONS: Elmiron 100 mg capsule 2 capsule PO BID  Metoprolol Succinate  Hydrocodone-Acetaminophen 5 mg-325 mg tablet 1 tablet PO Q 6 H PRN  Mobic 15 mg tablet 1 tablet PO Daily     GU PSH: Change Vaginal Graft - 09/08/2017 Cystoscopy Hydrodistention - 07/24/2017 Locm 300-399Mg /Ml Iodine,1Ml - 03/26/2018, 07/15/2017      PSH Notes: elbow 2018, Shoulder 2017, HPV removal 2016   NON-GU PSH: Cholecystectomy (laparoscopic) - 2014    GU PMH: Chronic cystitis (with hematuria) - 08/06/2017 Hemorrhagic cystitis - 07/01/2017 Gross hematuria - 06/19/2017 Nocturia - 06/19/2017 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 Has not smoked since 11/23/2016. Smoked for 20 years. Smoked 1/2 pack per day.   Tobacco Use Assessment Completed: Used Tobacco in last 30 days? Drinks 1 drink per day.  Drinks 4+ caffeinated drinks per day.    REVIEW OF SYSTEMS:    GU Review Female:   Patient denies frequent urination, hard to postpone urination, burning /pain with urination, get up at night to urinate, leakage of urine, stream starts and stops, trouble starting your stream, have to strain to urinate, and being pregnant.  Gastrointestinal (Upper):   Patient denies nausea, vomiting, and indigestion/ heartburn.  Gastrointestinal (Lower):   Patient denies diarrhea and constipation.  Constitutional:   Patient denies fever, night sweats, weight loss, and fatigue.  Skin:    Patient denies skin rash/ lesion and itching.  Eyes:   Patient denies blurred vision and double vision.  Ears/ Nose/ Throat:   Patient denies sore throat and sinus problems.  Hematologic/Lymphatic:   Patient denies swollen glands and easy bruising.  Cardiovascular:   Patient denies leg swelling and chest pains.  Respiratory:   Patient denies cough and shortness of breath.  Endocrine:   Patient denies excessive thirst.  Musculoskeletal:   Patient denies back pain and joint pain.  Neurological:   Patient denies headaches and dizziness.  Psychologic:   Patient denies depression and anxiety.   VITAL SIGNS: None   PAST DATA REVIEWED:  Source Of History:  Patient   PROCEDURES:          Urinalysis Dipstick Dipstick Cont'd  Color: Straw Bilirubin: Neg mg/dL  Appearance: Clear Ketones: Neg  mg/dL  Specific Gravity: <=1.610 Blood: Neg ery/uL  pH: 6.0 Protein: Neg mg/dL  Glucose: Neg mg/dL Urobilinogen: 0.2 mg/dL    Nitrites: Neg    Leukocyte Esterase: Neg leu/uL    ASSESSMENT:      ICD-10 Details  1 GU:   Chronic cystitis (with hematuria) - N30.21   2   Hemorrhagic cystitis - N30.01      PLAN:           Schedule Return Visit/Planned Activity: Return PRN - Office Visit             Note: we will call her   After a thorough review of the management options for the patient's condition the patient  elected to proceed with surgical therapy as noted above. We have discussed the potential benefits and risks of the procedure, side effects of the proposed treatment, the likelihood of the patient achieving the goals of the procedure, and any potential problems that might occur during the procedure or recuperation. Informed consent has been obtained.

## 2018-04-27 ENCOUNTER — Encounter (HOSPITAL_COMMUNITY): Payer: Self-pay | Admitting: Anesthesiology

## 2018-04-27 ENCOUNTER — Encounter (HOSPITAL_COMMUNITY)
Admission: RE | Disposition: A | Discharge status: Home / Self Care | Payer: Self-pay | Source: Ambulatory Visit | Attending: Urology

## 2018-04-27 ENCOUNTER — Ambulatory Visit (HOSPITAL_COMMUNITY)
Admission: RE | Admit: 2018-04-27 | Discharge: 2018-04-27 | Disposition: A | Discharge status: Home / Self Care | Payer: 59 | Source: Ambulatory Visit | Attending: Urology | Admitting: Urology

## 2018-04-27 ENCOUNTER — Ambulatory Visit (HOSPITAL_COMMUNITY): Payer: 59 | Admitting: Anesthesiology

## 2018-04-27 DIAGNOSIS — F17200 Nicotine dependence, unspecified, uncomplicated: Secondary | ICD-10-CM | POA: Insufficient documentation

## 2018-04-27 DIAGNOSIS — Z79899 Other long term (current) drug therapy: Secondary | ICD-10-CM | POA: Diagnosis not present

## 2018-04-27 DIAGNOSIS — N3010 Interstitial cystitis (chronic) without hematuria: Secondary | ICD-10-CM | POA: Insufficient documentation

## 2018-04-27 DIAGNOSIS — I10 Essential (primary) hypertension: Secondary | ICD-10-CM | POA: Diagnosis not present

## 2018-04-27 HISTORY — PX: CYSTO WITH HYDRODISTENSION: SHX5453

## 2018-04-27 SURGERY — CYSTOSCOPY, WITH BLADDER HYDRODISTENSION
Anesthesia: General | Site: Urethra

## 2018-04-27 MED ORDER — LIDOCAINE 2% (20 MG/ML) 5 ML SYRINGE
INTRAMUSCULAR | Status: DC | PRN
  Administered 2018-04-27: 100 mg via INTRAVENOUS

## 2018-04-27 MED ORDER — EPHEDRINE SULFATE-NACL 50-0.9 MG/10ML-% IV SOSY
PREFILLED_SYRINGE | INTRAVENOUS | Status: DC | PRN
  Administered 2018-04-27: 5 mg via INTRAVENOUS

## 2018-04-27 MED ORDER — BUPIVACAINE HCL (PF) 0.5 % IJ SOLN
INTRAMUSCULAR | Status: AC
  Filled 2018-04-27: qty 30

## 2018-04-27 MED ORDER — FENTANYL CITRATE (PF) 100 MCG/2ML IJ SOLN
INTRAMUSCULAR | Status: DC | PRN
  Administered 2018-04-27 (×2): 50 ug via INTRAVENOUS

## 2018-04-27 MED ORDER — LIDOCAINE-EPINEPHRINE 1 %-1:100000 IJ SOLN
INTRAMUSCULAR | Status: AC
  Filled 2018-04-27: qty 1

## 2018-04-27 MED ORDER — FENTANYL CITRATE (PF) 100 MCG/2ML IJ SOLN
INTRAMUSCULAR | Status: AC
  Filled 2018-04-27: qty 2

## 2018-04-27 MED ORDER — CLINDAMYCIN PHOSPHATE 2 % VA CREA
TOPICAL_CREAM | VAGINAL | Status: AC
  Filled 2018-04-27: qty 40

## 2018-04-27 MED ORDER — HYDROMORPHONE HCL 1 MG/ML IJ SOLN
0.2500 mg | INTRAMUSCULAR | Status: DC | PRN
  Administered 2018-04-27: 0.5 mg via INTRAVENOUS

## 2018-04-27 MED ORDER — MIDAZOLAM HCL 2 MG/2ML IJ SOLN
INTRAMUSCULAR | Status: AC
  Filled 2018-04-27: qty 2

## 2018-04-27 MED ORDER — EPHEDRINE 5 MG/ML INJ
INTRAVENOUS | Status: AC
  Filled 2018-04-27: qty 10

## 2018-04-27 MED ORDER — PROPOFOL 10 MG/ML IV BOLUS
INTRAVENOUS | Status: AC
  Filled 2018-04-27: qty 20

## 2018-04-27 MED ORDER — OXYCODONE HCL 5 MG/5ML PO SOLN
5.0000 mg | Freq: Once | ORAL | Status: DC | PRN
  Filled 2018-04-27: qty 5

## 2018-04-27 MED ORDER — SODIUM CHLORIDE 0.9 % IV SOLN
INTRAVENOUS | Status: AC
  Filled 2018-04-27: qty 500000

## 2018-04-27 MED ORDER — FLUORESCEIN SODIUM 10 % IV SOLN
INTRAVENOUS | Status: AC
  Filled 2018-04-27: qty 5

## 2018-04-27 MED ORDER — BUPIVACAINE HCL (PF) 0.5 % IJ SOLN
INTRAMUSCULAR | Status: DC | PRN
  Administered 2018-04-27: 20 mL

## 2018-04-27 MED ORDER — HYDROCODONE-ACETAMINOPHEN 5-325 MG PO TABS: 2.0000 | ORAL_TABLET | Freq: Four times a day (QID) | ORAL | 0 refills | Status: DC | PRN

## 2018-04-27 MED ORDER — CIPROFLOXACIN IN D5W 400 MG/200ML IV SOLN
400.0000 mg | INTRAVENOUS | Status: AC
  Administered 2018-04-27: 400 mg via INTRAVENOUS
  Filled 2018-04-27: qty 200

## 2018-04-27 MED ORDER — PROPOFOL 10 MG/ML IV BOLUS
INTRAVENOUS | Status: DC | PRN
  Administered 2018-04-27: 180 mg via INTRAVENOUS

## 2018-04-27 MED ORDER — LACTATED RINGERS IV SOLN
INTRAVENOUS | Status: DC
  Administered 2018-04-27 (×2): via INTRAVENOUS

## 2018-04-27 MED ORDER — STERILE WATER FOR IRRIGATION IR SOLN
Status: DC | PRN
  Administered 2018-04-27: 3000 mL via INTRAVESICAL

## 2018-04-27 MED ORDER — HYDROMORPHONE HCL 1 MG/ML IJ SOLN
INTRAMUSCULAR | Status: AC
  Filled 2018-04-27: qty 2

## 2018-04-27 MED ORDER — ONDANSETRON HCL 4 MG/2ML IJ SOLN
INTRAMUSCULAR | Status: DC | PRN
  Administered 2018-04-27: 4 mg via INTRAVENOUS

## 2018-04-27 MED ORDER — HYDROCODONE-ACETAMINOPHEN 5-325 MG PO TABS: 1.0000 | ORAL_TABLET | Freq: Four times a day (QID) | ORAL | 0 refills | Status: DC | PRN

## 2018-04-27 MED ORDER — DEXAMETHASONE SODIUM PHOSPHATE 10 MG/ML IJ SOLN
INTRAMUSCULAR | Status: DC | PRN
  Administered 2018-04-27: 10 mg via INTRAVENOUS

## 2018-04-27 MED ORDER — MIDAZOLAM HCL 5 MG/5ML IJ SOLN
INTRAMUSCULAR | Status: DC | PRN
  Administered 2018-04-27: 2 mg via INTRAVENOUS

## 2018-04-27 MED ORDER — OXYCODONE HCL 5 MG PO TABS: 5.0000 mg | ORAL_TABLET | Freq: Once | ORAL | Status: DC | PRN

## 2018-04-27 MED ORDER — PROMETHAZINE HCL 25 MG/ML IJ SOLN: 6.2500 mg | INTRAMUSCULAR | Status: DC | PRN

## 2018-04-27 SURGICAL SUPPLY — 24 items
BAG URO CATCHER STRL LF (MISCELLANEOUS) ×3 IMPLANT
BLADE SURG 15 STRL LF DISP TIS (BLADE) ×2 IMPLANT
BLADE SURG 15 STRL SS (BLADE) ×1
CATH FOLEY 2WAY SLVR  5CC 14FR (CATHETERS) ×1
CATH FOLEY 2WAY SLVR 5CC 14FR (CATHETERS) ×2 IMPLANT
CATH ROBINSON RED A/P 16FR (CATHETERS) IMPLANT
COVER MAYO STAND STRL (DRAPES) ×3 IMPLANT
DRAPE UNDERBUTTOCKS STRL (DRAPE) ×3 IMPLANT
ELECT PENCIL ROCKER SW 15FT (MISCELLANEOUS) ×3 IMPLANT
ELECT REM PT RETURN 15FT ADLT (MISCELLANEOUS) IMPLANT
GLOVE BIOGEL M STRL SZ7.5 (GLOVE) ×3 IMPLANT
GOWN STRL REUS W/TWL XL LVL3 (GOWN DISPOSABLE) ×3 IMPLANT
HOLDER FOLEY CATH W/STRAP (MISCELLANEOUS) IMPLANT
KIT BASIN OR (CUSTOM PROCEDURE TRAY) ×3 IMPLANT
NEEDLE HYPO 25X1 1.5 SAFETY (NEEDLE) IMPLANT
PACK CYSTO (CUSTOM PROCEDURE TRAY) ×3 IMPLANT
PLUG CATH AND CAP STER (CATHETERS) ×3 IMPLANT
SHEET LAVH (DRAPES) ×3 IMPLANT
SYR 10ML LL (SYRINGE) ×3 IMPLANT
SYR 20CC LL (SYRINGE) ×3 IMPLANT
TOWEL OR 17X26 10 PK STRL BLUE (TOWEL DISPOSABLE) ×3 IMPLANT
TOWEL OR NON WOVEN STRL DISP B (DISPOSABLE) ×3 IMPLANT
TUBING CONNECTING 10 (TUBING) ×6 IMPLANT
YANKAUER SUCT BULB TIP 10FT TU (MISCELLANEOUS) ×3 IMPLANT

## 2018-04-27 NOTE — Anesthesia Postprocedure Evaluation (Signed)
Anesthesia Post Note  Patient: Danielle Gould  Procedure(s) Performed: CYSTOSCOPY/HYDRODISTENSION AND INSTILLATION/EXAMINATION UNDER ANESTHESIA (N/A Urethra)     Patient location during evaluation: PACU Anesthesia Type: General Level of consciousness: awake and alert Pain management: pain level controlled Vital Signs Assessment: post-procedure vital signs reviewed and stable Respiratory status: spontaneous breathing, nonlabored ventilation, respiratory function stable and patient connected to nasal cannula oxygen Cardiovascular status: blood pressure returned to baseline and stable Postop Assessment: no apparent nausea or vomiting Anesthetic complications: no    Last Vitals:  Vitals:   04/27/18 1230 04/27/18 1254  BP: (!) 139/93 (!) 118/93  Pulse: 77 81  Resp: 20 16  Temp: 36.6 C 36.4 C  SpO2: 97% 98%    Last Pain:  Vitals:   04/27/18 1254  TempSrc:   PainSc: 3                  Ryan P Ellender

## 2018-04-27 NOTE — Discharge Instructions (Signed)
I have reviewed discharge instructions in detail with the patient. They will follow-up with me or their physician as scheduled. My nurse will also be calling the patients as per protocol.   General Anesthesia, Adult, Care After These instructions provide you with information about caring for yourself after your procedure. Your health care provider may also give you more specific instructions. Your treatment has been planned according to current medical practices, but problems sometimes occur. Call your health care provider if you have any problems or questions after your procedure. What can I expect after the procedure? After the procedure, it is common to have:  Vomiting.  A sore throat.  Mental slowness.  It is common to feel:  Nauseous.  Cold or shivery.  Sleepy.  Tired.  Sore or achy, even in parts of your body where you did not have surgery.  Follow these instructions at home: For at least 24 hours after the procedure:  Do not: ? Participate in activities where you could fall or become injured. ? Drive. ? Use heavy machinery. ? Drink alcohol. ? Take sleeping pills or medicines that cause drowsiness. ? Make important decisions or sign legal documents. ? Take care of children on your own.  Rest. Eating and drinking  If you vomit, drink water, juice, or soup when you can drink without vomiting.  Drink enough fluid to keep your urine clear or pale yellow.  Make sure you have little or no nausea before eating solid foods.  Follow the diet recommended by your health care provider. General instructions  Have a responsible adult stay with you until you are awake and alert.  Return to your normal activities as told by your health care provider. Ask your health care provider what activities are safe for you.  Take over-the-counter and prescription medicines only as told by your health care provider.  If you smoke, do not smoke without supervision.  Keep all  follow-up visits as told by your health care provider. This is important. Contact a health care provider if:  You continue to have nausea or vomiting at home, and medicines are not helpful.  You cannot drink fluids or start eating again.  You cannot urinate after 8-12 hours.  You develop a skin rash.  You have fever.  You have increasing redness at the site of your procedure. Get help right away if:  You have difficulty breathing.  You have chest pain.  You have unexpected bleeding.  You feel that you are having a life-threatening or urgent problem. This information is not intended to replace advice given to you by your health care provider. Make sure you discuss any questions you have with your health care provider. Document Released: 11/17/2000 Document Revised: 01/14/2016 Document Reviewed: 07/26/2015 Elsevier Interactive Patient Education  [employer].

## 2018-04-27 NOTE — Anesthesia Preprocedure Evaluation (Addendum)
Anesthesia Evaluation  Patient identified by MRN, date of birth, ID band Patient awake    Reviewed: Allergy & Precautions, NPO status , Patient's Chart, lab work & pertinent test results, reviewed documented beta blocker date and time   History of Anesthesia Complications (+) DIFFICULT AIRWAY  Airway Mallampati: III  TM Distance: >3 FB Neck ROM: Full    Dental no notable dental hx.    Pulmonary Current Smoker,    Pulmonary exam normal breath sounds clear to auscultation       Cardiovascular hypertension, Pt. on home beta blockers Normal cardiovascular exam Rhythm:Regular Rate:Normal  ECG: NSR, rate 73   Neuro/Psych negative neurological ROS  negative psych ROS   GI/Hepatic negative GI ROS, Neg liver ROS,   Endo/Other  negative endocrine ROS  Renal/GU negative Renal ROS     Musculoskeletal negative musculoskeletal ROS (+)   Abdominal (+) + obese,   Peds  Hematology negative hematology ROS (+)   Anesthesia Other Findings VAGINAL MESH  INTERSTITIAL CYSTITIS  Reproductive/Obstetrics hcg negative                            Anesthesia Physical Anesthesia Plan  ASA: III  Anesthesia Plan: General   Post-op Pain Management:    Induction: Intravenous  PONV Risk Score and Plan: 2 and Ondansetron, Dexamethasone and Treatment may vary due to age or medical condition  Airway Management Planned: LMA  Additional Equipment:   Intra-op Plan:   Post-operative Plan: Extubation in OR  Informed Consent: I have reviewed the patients History and Physical, chart, labs and discussed the procedure including the risks, benefits and alternatives for the proposed anesthesia with the patient or authorized representative who has indicated his/her understanding and acceptance.   Dental advisory given  Plan Discussed with: CRNA  Anesthesia Plan Comments:         Anesthesia Quick Evaluation

## 2018-04-27 NOTE — Op Note (Addendum)
Preoperative Diagnosis: interstitial cystitis  Postoperative Diagnosis: interstitial cystitis  Procedure(s) Performed:  - Exam under anesthesia - Cystoscopy w/ hydrodistension + bladder instillation of local anesthetic   Surgeon:  Surgeon(s): Alfredo Martinez, MD  Resident Surgeon: Lenetta Quaker, MD  Assistant(s):  none  Anesthesia:  General  Fluids:  See anesthesia record  Estimated blood loss: minimal   Specimens:  none  Drains:  none  Complications:  None  Indications:  This is a 47 y.o. patient with a history of IC and recent urethral mesh removal. After discussion of the risks & benefits and alternatives to surgical approach, the patient wishes to proceed with diagnostic cystoscopy, hydrodistension. Risks & benefits of the procedure discussed with the patient, who wishes to proceed.   Findings:   1. EUA revealed no evidence of exposed or retained mesh within vagina, urethra or bladder. Small amount of granulation tissue at posterior vagina, prior incision site. Large amount of redundant vaginal ruggae.  2. Cystoscopy demonstrated mild diffuse petechial changes post hydrodistension, otherwise normal exam 3. Bladder capacity ~500 cc   Procedure:  Patient was brought into the operative suite and general anesthesia was induced. Pre-surgical time out was administered. Appropriate pre-operative antibiotics were administered. Patient was prepped and draped in usual standard fashion and placed into the high lithotomy position.   We began the procedure with a complete vaginal exam under anesthesia. The bladder was emptied with placement of a 73F foley catheter. Using a large metal speculum we were able to fully visualize the entire vaginal vault with particular attention to the anterior surface and site of prior mesh placement near the urethra. The above findings were noted. There was no appearance or palpable suggestion of exposed or residual mesh.   Next, we performed a  complete cystourethroscopy with a rigid cystoscope. The urethra was normal. The bladder appeared generally normal on initial entry. We hydrodistended her bladder by gravity drainage with a ~60cm column of normal saline. Maximal hydrodistension was maintained for 5 minutes. The bladder was emptied and the volume was measured at ~500cc bladder capacity. We resurveyed the bladder following this and noted mild to moderate diffuse petechial changes throuout, although no severe mucosal bleeding. The bladder was emptied  We instilled a solution of 31ml 0.5% lidocaine through a foley catheter and the catheter was removed. This concluded the procedure.   Plan: 1. Discharge from PACU once meeting criteria   Present for entire case; no mesh with careful exam; positive HOD; instilled 20 cc of marcaine

## 2018-04-27 NOTE — Anesthesia Procedure Notes (Signed)
Procedure Name: LMA Insertion Date/Time: 04/27/2018 11:09 AM Performed by: Wynonia Sours, CRNA Pre-anesthesia Checklist: Patient identified, Emergency Drugs available, Suction available, Patient being monitored and Timeout performed Patient Re-evaluated:Patient Re-evaluated prior to induction Oxygen Delivery Method: Circle system utilized Preoxygenation: Pre-oxygenation with 100% oxygen Induction Type: IV induction LMA: LMA inserted LMA Size: 4.0 Number of attempts: 1 Placement Confirmation: positive ETCO2 and breath sounds checked- equal and bilateral Tube secured with: Tape Dental Injury: Teeth and Oropharynx as per pre-operative assessment

## 2018-04-27 NOTE — Interval H&P Note (Signed)
History and Physical Interval Note:  04/27/2018 10:29 AM  Danielle Gould  has presented today for surgery, with the diagnosis of VAGINAL MESH AND INTERSTITIAL CYSTITIS  The various methods of treatment have been discussed with the patient and family. After consideration of risks, benefits and other options for treatment, the patient has consented to  Procedure(s): CYSTOSCOPY/HYDRODISTENSION AND INSTILLATION (N/A) EXCISION OF MESH (N/A) as a surgical intervention .  The patient's history has been reviewed, patient examined, no change in status, stable for surgery.  I have reviewed the patient's chart and labs.  Questions were answered to the patient's satisfaction.     Courtez Twaddle A

## 2018-04-27 NOTE — Transfer of Care (Signed)
Immediate Anesthesia Transfer of Care Note  Patient: Danielle Gould  Procedure(s) Performed: Procedure(s): CYSTOSCOPY/HYDRODISTENSION AND INSTILLATION/EXAMINATION UNDER ANESTHESIA (N/A)  Patient Location: PACU  Anesthesia Type:General  Level of Consciousness:  sedated, patient cooperative and responds to stimulation  Airway & Oxygen Therapy:Patient Spontanous Breathing and Patient connected to face mask oxgen  Post-op Assessment:  Report given to PACU RN and Post -op Vital signs reviewed and stable  Post vital signs:  Reviewed and stable  Last Vitals:  Vitals:   04/27/18 0924  BP: 140/90  Pulse: 83  Resp: 16  Temp: 36.6 C  SpO2: 100%    Complications: No apparent anesthesia complications

## 2018-04-28 ENCOUNTER — Encounter (HOSPITAL_COMMUNITY): Payer: Self-pay | Admitting: Urology

## 2018-08-06 ENCOUNTER — Ambulatory Visit: Payer: 59 | Admitting: Podiatry

## 2018-08-06 VITALS — BP 96/78 | HR 74

## 2018-08-06 DIAGNOSIS — L600 Ingrowing nail: Secondary | ICD-10-CM | POA: Diagnosis not present

## 2018-08-06 MED ORDER — NEOMYCIN-POLYMYXIN-HC 3.5-10000-1 OT SOLN: OTIC | 0 refills | Status: DC

## 2018-08-06 MED ORDER — NEOMYCIN-POLYMYXIN-HC 3.5-10000-1 OT SOLN: OTIC | 1 refills | Status: DC

## 2018-08-06 NOTE — Patient Instructions (Signed)

## 2018-08-08 NOTE — Progress Notes (Signed)
Subjective:   Patient ID: Danielle Gould, female   DOB: 47 y.o.   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  Patient states they are very sore and they are making it hard to wear shoe gear comfortably.  Patient smokes a half a pack per day and likes to be active   Review of Systems  All other systems reviewed and are negative.       Objective:  Physical Exam Vitals signs and nursing note reviewed.  Constitutional:      Appearance: She is well-developed.  Pulmonary:     Effort: Pulmonary effort is normal.  Musculoskeletal: Normal range of motion.  Skin:    General: Skin is warm.  Neurological:     Mental Status: She is alert.     Neurovascular status intact muscle strength is adequate range of motion within normal limits with patient found to have painful hallux nails bilateral that are thick dystrophic and impossible for her to cut.  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  Patient wants surgery understanding the procedure and the risk associated with it and she is willing to accept this and at this time I infiltrated each hallux 60 mg like Marcaine mixture sterile prep applied to the toe and using sterile instrumentation remove the hallux nails exposed matrix and applied phenol 5 applications 30 seconds followed by alcohol lavage to each nail bed.  Applied sterile dressings gave instructions for soaks and instructed on leaving the dressing on 24 hours but to take it off early if any throbbing or other pathology were to occur.  Wrote prescription for Corticosporin otic solution to utilize after the  procedure and encouraged her to call us with any questions concerns and will be seen back 2 weeks

## 2018-08-19 ENCOUNTER — Other Ambulatory Visit: Payer: 59

## 2018-09-16 ENCOUNTER — Encounter

## 2018-09-16 ENCOUNTER — Other Ambulatory Visit: Payer: Self-pay

## 2018-09-16 ENCOUNTER — Encounter: Payer: Self-pay | Admitting: Neurology

## 2018-09-16 ENCOUNTER — Ambulatory Visit: Payer: 59 | Admitting: Neurology

## 2018-09-16 VITALS — BP 134/90 | HR 84 | Resp 16 | Ht 65.0 in | Wt 231.0 lb

## 2018-09-16 DIAGNOSIS — R202 Paresthesia of skin: Secondary | ICD-10-CM

## 2018-09-16 DIAGNOSIS — R531 Weakness: Secondary | ICD-10-CM | POA: Diagnosis not present

## 2018-09-16 DIAGNOSIS — G2581 Restless legs syndrome: Secondary | ICD-10-CM

## 2018-09-16 MED ORDER — GABAPENTIN 300 MG PO CAPS: 300.0000 mg | ORAL_CAPSULE | Freq: Every day | ORAL | 3 refills | Status: DC

## 2018-09-16 NOTE — Progress Notes (Signed)
PATIENT: Danielle Gould DOB: 09/06/1970  Chief Complaint  Patient presents with  . Numbness/Weakness    Rm. 4.  Here alone. 3 episodes of fecal incontinence in Nov. 2019. Sts. has random intermittent left arm weakness and numbness in all fingertips of left hand./fim  . PCP    Jarrett SohoWharton, Courtney, PA-C     HISTORICAL  Danielle Gould is a 48 year old female, seen in request by her primary care PA Jarrett SohoCourtney Wharton for evaluation of numbness and weakness, initial evaluation was on September 16, 2018  I have reviewed and summarized the referring note from the referring physician.  She had a past medical history of hypertension, anxiety, chronic cystitis with hematuria,   In November 2018, she had prolonged bleeding, per patient, was eventually diagnosed due to complication from her previous bladder sling surgery, there was complications from previous mesh that was put in, break into vaginal wall, she did have removal of exposed vaginal wall mesh and cystoscopy per Alfredo MartinezScott MacDiarmid on September 08, 2017.  Despite that, she had intermittent episode of lower spine pain, radiating pain to lateral pelvic, perineal region, which has put significant limitation in her function.  She denied persistent bilateral lower extremity sensory loss or gait abnormality, does have intermittent low back pain, also occasionally sudden onset episode of worsening left hand paresthesia involving 2nd-4th fingers, drop of left arm muscle tone.  She also reported 3 episode of sudden onset of bowel incontinence in November 2019, but has no recurrence since then,  laboratory evaluations in November 2019 reported normal CBC CMP  MRI of pelvic in November 2018: No cause of hematuria identified in pelvic, bladder appears unremarkable, degenerative change of sacroiliac joints   REVIEW OF SYSTEMS: Full 14 system review of systems performed and notable only for fatigue, incontinence, joint pain, achy muscles, memory loss, headache,  numbness, dizziness, restless leg All other review of systems were negative.  ALLERGIES: No Known Allergies  HOME MEDICATIONS: Current Outpatient Medications  Medication Sig Dispense Refill  . acetaminophen (TYLENOL) 500 MG tablet Take 2,000 mg by mouth every 6 (six) hours as needed (for pain.).    Marland Kitchen. ibuprofen (ADVIL,MOTRIN) 200 MG tablet Take 800 mg by mouth every 6 (six) hours as needed (for pain.).    Marland Kitchen. metoprolol succinate (TOPROL-XL) 50 MG 24 hr tablet Take 50 mg by mouth daily.  1  . pentosan polysulfate (ELMIRON) 100 MG capsule Take 200 mg by mouth 2 (two) times daily.     No current facility-administered medications for this visit.     PAST MEDICAL HISTORY: Past Medical History:  Diagnosis Date  . Chronic constipation   . Chronic diarrhea   . Difficult intubation   . Gallstones   . H/O transurethral destruction of bladder lesion    [facility]  . Hemorrhoids   . History of migraine    last one a wk ago and pt states menstrual related  . Hypertension    takes Metoprolol daily  . Insomnia    takes Restoril prn   . Lower extremity weakness   . Numbness   . Urinary frequency     PAST SURGICAL HISTORY: Past Surgical History:  Procedure Laterality Date  . CERVICAL CONIZATION W/BX N/A 12/07/2012   Procedure: CONIZATION CERVIX WITH BIOPSY;  Surgeon: Geryl RankinsEvelyn Varnado, MD;  Location: [facility];  Service: Gynecology;  Laterality: N/A;  . CHOLECYSTECTOMY  04/01/2012   Procedure: LAPAROSCOPIC CHOLECYSTECTOMY WITH INTRAOPERATIVE CHOLANGIOGRAM;  Surgeon: Mary SellaEric M  Andrey Campanile, MD,FACS;  Location: [facility];  Service: General;  Laterality: N/A;  . COLONOSCOPY    . COLPOSCOPY N/A 12/07/2012   Procedure: COLPOSCOPY;  Surgeon: Geryl Rankins, MD;  Location: [facility];  Service: Gynecology;  Laterality: N/A;  . CYSTO WITH HYDRODISTENSION N/A 04/27/2018   Procedure: CYSTOSCOPY/HYDRODISTENSION AND INSTILLATION/EXAMINATION UNDER ANESTHESIA;  Surgeon: Alfredo Martinez, MD;   Location: [facility];  Service: Urology;  Laterality: N/A;  . ESOPHAGOGASTRODUODENOSCOPY    . EXCISION OF MESH N/A 09/08/2017   Procedure: REMOVAL OF EXPOSED VAGINAL MESH;  Surgeon: Alfredo Martinez, MD;  Location: [facility];  Service: Urology;  Laterality: N/A;  . INCONTINENCE SURGERY  7-38yrs ago  . uterus collapsed  7-48yrs ago    FAMILY HISTORY: Family History  Problem Relation Age of Onset  . Breast cancer Mother   . Diverticulitis Mother   . Other Father        unsure of history  . Breast cancer Maternal Grandmother   . Ovarian cancer Maternal Grandmother   . Aneurysm Maternal Grandfather     SOCIAL HISTORY: Social History   Socioeconomic History  . Marital status: Married    Spouse name: Alinda Money  . Number of children: 1  . Years of education: 62  . Highest education level: Associate degree: academic program  Occupational History  . Not on file  Social Needs  . Financial resource strain: Not on file  . Food insecurity:    Worry: Not on file    Inability: Not on file  . Transportation needs:    Medical: Not on file    Non-medical: Not on file  Tobacco Use  . Smoking status: Current Every Day Smoker    Packs/day: 0.50    Years: 26.00    Pack years: 13.00    Types: Cigarettes  . Smokeless tobacco: Never Used  Substance and Sexual Activity  . Alcohol use: Yes    Comment: socially  . Drug use: No  . Sexual activity: Never  Lifestyle  . Physical activity:    Days per week: Not on file    Minutes per session: Not on file  . Stress: Not on file  Relationships  . Social connections:    Talks on phone: Not on file    Gets together: Not on file    Attends religious service: Not on file    Active member of club or organization: Not on file    Attends meetings of clubs or organizations: Not on file    Relationship status: Not on file  . Intimate partner violence:    Fear of current or ex partner: Not on file    Emotionally abused: Not on file    Physically abused: Not  on file    Forced sexual activity: Not on file  Other Topics Concern  . Not on file  Social History Narrative   Lives at home with husband and husband's 2 grandchildren, ages 61 and 82.   Left handed   Caffeine use:  cups per day.     PHYSICAL EXAM   Vitals:   09/16/18 1513  BP: 134/90  Pulse: 84  Resp: 16  Weight: 231 lb (104.8 kg)  Height: 5\' 5"  (1.651 m)    Not recorded      Body mass index is 38.44 kg/m.  PHYSICAL EXAMNIATION:  Gen: NAD, conversant, well nourised, obese, well groomed                     Cardiovascular:  Regular rate rhythm, no peripheral edema, warm, nontender. Eyes: Conjunctivae clear without exudates or hemorrhage Neck: Supple, no carotid bruits. Pulmonary: Clear to auscultation bilaterally   NEUROLOGICAL EXAM:  MENTAL STATUS: Speech:    Speech is normal; fluent and spontaneous with normal comprehension.  Cognition:     Orientation to time, place and person     Normal recent and remote memory     Normal Attention span and concentration     Normal Language, naming, repeating,spontaneous speech     Fund of knowledge   CRANIAL NERVES: CN II: Visual fields are full to confrontation. Fundoscopic exam is normal with sharp discs and no vascular changes. Pupils are round equal and briskly reactive to light. CN III, IV, VI: extraocular movement are normal. No ptosis. CN V: Facial sensation is intact to pinprick in all 3 divisions bilaterally. Corneal responses are intact.  CN VII: Face is symmetric with normal eye closure and smile. CN VIII: Hearing is normal to rubbing fingers CN IX, X: Palate elevates symmetrically. Phonation is normal. CN XI: Head turning and shoulder shrug are intact CN XII: Tongue is midline with normal movements and no atrophy.  MOTOR: There is no pronator drift of out-stretched arms. Muscle bulk and tone are normal. Muscle strength is normal.  REFLEXES: Reflexes are 2+ and symmetric at the biceps, triceps, knees, and  ankles. Plantar responses are flexor.  SENSORY: Intact to light touch, pinprick, positional sensation and vibratory sensation are intact in fingers and toes.  COORDINATION: Rapid alternating movements and fine finger movements are intact. There is no dysmetria on finger-to-nose and heel-knee-shin.    GAIT/STANCE: Posture is normal. Gait is steady with normal steps, base, arm swing, and turning. Heel and toe walking are normal. Tandem gait is normal.  Romberg is absent.   DIAGNOSTIC DATA (LABS, IMAGING, TESTING) - I reviewed patient records, labs, notes, testing and imaging myself where available.   ASSESSMENT AND PLAN  JALYIAH PURYEAR is a 48 y.o. female   Low back pain, pelvic area pain Intermittent left upper extremity paresthesia weakness  MRI of cervical, lumbar spine to rule out structural lesion  Laboratory evaluations to rule out treatable etiology  EMG nerve conduction study   Levert Feinstein, M.D. Ph.D.  [facility] [address] [place], [place] [address] Ph: [phone] Fax: [phone]  CC: Jarrett Soho, PA-C

## 2018-09-17 ENCOUNTER — Telehealth: Payer: Self-pay | Admitting: *Deleted

## 2018-09-17 ENCOUNTER — Encounter: Payer: Self-pay | Admitting: Neurology

## 2018-09-17 ENCOUNTER — Telehealth: Payer: Self-pay | Admitting: Neurology

## 2018-09-17 LAB — HEMOGLOBIN A1C
Est. average glucose Bld gHb Est-mCnc: 108 mg/dL
Hgb A1c MFr Bld: 5.4 % (ref 4.8–5.6)

## 2018-09-17 LAB — VITAMIN B12: Vitamin B-12: 501 pg/mL (ref 232–1245)

## 2018-09-17 LAB — FERRITIN: Ferritin: 52 ng/mL (ref 15–150)

## 2018-09-17 LAB — RPR: RPR Ser Ql: NONREACTIVE

## 2018-09-17 NOTE — Telephone Encounter (Signed)
lvm for pt to be aware

## 2018-09-17 NOTE — Telephone Encounter (Signed)
I also left [facility] phone number of [phone] and to give them a call if she has not heard from them in the next 2-3 business days.

## 2018-09-17 NOTE — Telephone Encounter (Signed)
[employer] order sent to [facility]. They will obtain the auth and reach out to the pt to schedule.  °

## 2018-09-17 NOTE — Telephone Encounter (Signed)
-----   Message from Levert Feinstein, MD sent at 09/17/2018  9:14 AM EST ----- Please call patient for normal laboratory result

## 2018-09-17 NOTE — Telephone Encounter (Signed)
Spoke to patient and notified her of the lab results. 

## 2018-09-22 ENCOUNTER — Other Ambulatory Visit: Payer: Self-pay | Admitting: Obstetrics and Gynecology

## 2018-09-22 DIAGNOSIS — N6310 Unspecified lump in the right breast, unspecified quadrant: Secondary | ICD-10-CM

## 2018-09-27 ENCOUNTER — Ambulatory Visit
Admission: RE | Admit: 2018-09-27 | Discharge: 2018-09-27 | Disposition: A | Discharge status: Home / Self Care | Payer: 59 | Source: Ambulatory Visit | Attending: Obstetrics and Gynecology | Admitting: Obstetrics and Gynecology

## 2018-09-27 DIAGNOSIS — N6310 Unspecified lump in the right breast, unspecified quadrant: Secondary | ICD-10-CM

## 2018-10-22 ENCOUNTER — Ambulatory Visit
Admission: RE | Admit: 2018-10-22 | Discharge: 2018-10-22 | Disposition: A | Discharge status: Home / Self Care | Payer: 59 | Source: Ambulatory Visit | Attending: Neurology | Admitting: Neurology

## 2018-10-22 ENCOUNTER — Ambulatory Visit (INDEPENDENT_AMBULATORY_CARE_PROVIDER_SITE_OTHER): Payer: 59 | Admitting: Neurology

## 2018-10-22 DIAGNOSIS — R202 Paresthesia of skin: Secondary | ICD-10-CM

## 2018-10-22 DIAGNOSIS — R531 Weakness: Secondary | ICD-10-CM

## 2018-10-22 DIAGNOSIS — Z0289 Encounter for other administrative examinations: Secondary | ICD-10-CM

## 2018-10-22 NOTE — Procedures (Signed)
        Full Name: Danielle Gould Gender: Female MRN #: [id number] Date of Birth: December 11, 1970    Visit Date: 10/22/2018 11:36 Age: 48 Years 0 Months Old History: 48 years old female presented with intermittent left hand paresthesia  Summary of the tests: Nerve conduction study: Left median sensory responses showed mildly prolonged peak latency with some mildly decreased to snap amplitude.  Left ulnar sensory responses also showed mildly decreased snap amplitude. Left sural sensory response was normal.  The median, ulnar motor responses were normal.  Electromyography: Selected needle examinations were normal at the left upper extremity muscles and left cervical paraspinal muscles.    Conclusion: This is a mild abnormal study.  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    ------------------------------- Levert Feinstein, M.D. PhD  [facility] [address] [place], [place] [address] Tel: [phone] Fax: [phone]        [facility]    Nerve / Sites Muscle Latency Ref. Amplitude Ref. Rel Amp Segments Distance Velocity Ref. Area    ms ms mV mV %  cm m/s m/s mVms  L Median - APB     Wrist APB 4.3 ?4.4 9.9 ?4.0 100 Wrist - APB 7   41.3     Upper arm APB 8.1  8.8  88.7 Upper arm - Wrist 19 49 ?49 37.0  L Ulnar - ADM     Wrist ADM 2.3 ?3.3 11.3 ?6.0 100 Wrist - ADM 7   30.9     B.Elbow ADM 5.1  11.7  104 B.Elbow - Wrist 15 54 ?49 34.1     A.Elbow ADM 7.0  11.6  99 A.Elbow - B.Elbow 11 57 ?49 33.3         A.Elbow - Wrist             SNC    Nerve / Sites Rec. Site Peak Lat Ref.  Amp Ref. Segments Distance    ms ms V V  cm  L Median - Digit II (Antidromic)     Wrist Dig II 4.0 ?3.5 14 ?20 Wrist - Dig II 13  L Ulnar - Digit V (Antidromic)     Wrist Dig V 2.4 ?3.1 7 ?15 Wrist - Dig V 11  L Sural - Ankle (Calf)     Calf Ankle 3.8 ?4.4  10 ?6 Calf - Ankle 14           F  Wave    Nerve F Lat Ref.   ms ms  L Median - APB 26.5 ?31.0  L Ulnar - ADM 25.9 ?32.0         EMG       EMG Summary Table    Spontaneous MUAP Recruitment  Muscle IA Fib PSW Fasc Other Amp Dur. Poly Pattern  L. First dorsal interosseous Normal None None None _______ Normal Normal Normal Normal  L. Pronator teres Normal None None None _______ Normal Normal Normal Normal  L. Biceps brachii Normal None None None _______ Normal Normal Normal Normal  L. Deltoid Normal None None None _______ Normal Normal Normal Normal  L. Triceps brachii Normal None None None _______ Normal Normal Normal Normal  L. Cervical paraspinals Normal None None None _______ Normal Normal Normal Normal

## 2018-10-25 ENCOUNTER — Telehealth: Payer: Self-pay | Admitting: Neurology

## 2018-10-25 NOTE — Telephone Encounter (Signed)
Please call patient, MRI of cervical and lumbar spine showed no significant abnormalities  IMPRESSION: Unremarkable MRI scan cervical spine without contrast  IMPRESSION: Slightly abnormal MRI scan of the lumbar spine showing only minor disc degenerative changes but no frank disc herniation or significant foraminal root compression.

## 2018-10-25 NOTE — Telephone Encounter (Signed)
Spoke to patient - she is aware of her MRI results.  

## 2018-11-24 ENCOUNTER — Ambulatory Visit (INDEPENDENT_AMBULATORY_CARE_PROVIDER_SITE_OTHER): Payer: 59

## 2018-11-24 ENCOUNTER — Encounter: Payer: Self-pay | Admitting: Podiatry

## 2018-11-24 ENCOUNTER — Other Ambulatory Visit: Payer: Self-pay

## 2018-11-24 ENCOUNTER — Ambulatory Visit: Payer: 59 | Admitting: Podiatry

## 2018-11-24 VITALS — Temp 99.1°F

## 2018-11-24 DIAGNOSIS — M722 Plantar fascial fibromatosis: Secondary | ICD-10-CM

## 2018-11-24 MED ORDER — TRIAMCINOLONE ACETONIDE 10 MG/ML IJ SUSP
10.0000 mg | Freq: Once | INTRAMUSCULAR | Status: AC
  Administered 2018-11-24: 10 mg

## 2018-11-24 NOTE — Patient Instructions (Signed)

## 2018-11-24 NOTE — Progress Notes (Signed)
Subjective:   Patient ID: Danielle Gould, female   DOB: 48 y.o.   MRN: [id number]   HPI Patient presents stating the bottom of both heels is been very tender and the left one has been very bad and it is been going on for at least 3 weeks and she does not remember specific injury.  States the right one also hurts but not to the same degree and both of them are making it difficult for her to be active   ROS      Objective:  Physical Exam  Neurovascular status intact with patient found to have exquisite discomfort plantar aspect heel left over right with inflammation fluid of the medial band bilateral     Assessment:  Acute plantar fasciitis bilateral with left being worse with moderate depression of the arch     Plan:  X-rays reviewed and at this time I did sterile prep and injected the plantar fascial bilateral 3 mg Kenalog 5 mg Xylocaine advised on physical therapy anti-inflammatories dispense fascial brace left and I discussed shoe gear modifications.  Reappoint 2 weeks or earlier if needed  X-rays indicate there is small spur formation bilateral with no indications of stress fracture or advanced arthritis

## 2018-12-08 ENCOUNTER — Ambulatory Visit: Payer: 59 | Admitting: Podiatry

## 2018-12-22 ENCOUNTER — Ambulatory Visit: Payer: 59 | Admitting: Podiatry

## 2018-12-22 ENCOUNTER — Other Ambulatory Visit: Payer: Self-pay

## 2018-12-22 ENCOUNTER — Encounter: Payer: Self-pay | Admitting: Podiatry

## 2018-12-22 VITALS — Temp 97.5°F

## 2018-12-22 DIAGNOSIS — M722 Plantar fascial fibromatosis: Secondary | ICD-10-CM | POA: Diagnosis not present

## 2018-12-22 MED ORDER — DICLOFENAC SODIUM 75 MG PO TBEC: 75.0000 mg | DELAYED_RELEASE_TABLET | Freq: Two times a day (BID) | ORAL | 2 refills | Status: DC

## 2018-12-22 MED ORDER — TRIAMCINOLONE ACETONIDE 10 MG/ML IJ SUSP
10.0000 mg | Freq: Once | INTRAMUSCULAR | Status: AC
  Administered 2018-12-22: 10 mg

## 2018-12-23 NOTE — Progress Notes (Signed)
Subjective:   Patient ID: Danielle Gould, female   DOB: 48 y.o.   MRN: [id number]   HPI Patient presents stating she is having a lot of pain in the bottom of the left heel and states it did not respond well to the injection and stated the right one did very well but the left foot is always hurt her more.  Patient states it almost feels broken   ROS      Objective:  Physical Exam  Acute plantar fasciitis when pressure in the left heel with fluid buildup around the insertion with no pain of the actual calcaneus itself with the right when doing much better     Assessment:  Acute plantar fasciitis left with inability to bear weight currently on the plantar heel     Plan:  H&P condition reviewed and went ahead today did sterile prep and injected the fascia 3 mg Kenalog 5 mg Xylocaine and applied boot to offload all weight off the heel.  Advised on reduced activities and to wear the boot full-time 2 weeks and then to gradually reduce it and educated her on boot usage.  Reappoint to recheck

## 2018-12-29 ENCOUNTER — Other Ambulatory Visit: Payer: Self-pay | Admitting: Neurology

## 2019-01-12 ENCOUNTER — Ambulatory Visit: Payer: 59 | Admitting: Podiatry

## 2019-01-24 ENCOUNTER — Ambulatory Visit: Payer: 59 | Admitting: Podiatry

## 2019-03-16 ENCOUNTER — Encounter: Payer: Self-pay | Admitting: Podiatry

## 2019-03-16 ENCOUNTER — Ambulatory Visit: Payer: 59 | Admitting: Podiatry

## 2019-03-16 ENCOUNTER — Other Ambulatory Visit: Payer: Self-pay

## 2019-03-16 VITALS — Temp 97.9°F

## 2019-03-16 DIAGNOSIS — M722 Plantar fascial fibromatosis: Secondary | ICD-10-CM

## 2019-03-16 NOTE — Patient Instructions (Signed)
Pre-Operative Instructions  Congratulations, you have decided to take an important step towards improving your quality of life.  You can be assured that the doctors and staff at [facility] will be with you every step of the way.  Here are some important things you should know:  1. Plan to be at the surgery center/hospital at least 1 (one) hour prior to your scheduled time, unless otherwise directed by the surgical center/hospital staff.  You must have a responsible adult accompany you, remain during the surgery and drive you home.  Make sure you have directions to the surgical center/hospital to ensure you arrive on time. 2. If you are having surgery at [facility], you will need a copy of your medical history and physical form from your family physician within one month prior to the date of surgery. We will give you a form for your primary physician to complete.  3. We make every effort to accommodate the date you request for surgery.  However, there are times where surgery dates or times have to be moved.  We will contact you as soon as possible if a change in schedule is required.   4. No aspirin/ibuprofen for one week before surgery.  If you are on aspirin, any non-steroidal anti-inflammatory medications (Mobic, Aleve, Ibuprofen) should not be taken seven (7) days prior to your surgery.  You make take Tylenol for pain prior to surgery.  5. Medications - If you are taking daily heart and blood pressure medications, seizure, reflux, allergy, asthma, anxiety, pain or diabetes medications, make sure you notify the surgery center/hospital before the day of surgery so they can tell you which medications you should take or avoid the day of surgery. 6. No food or drink after midnight the night before surgery unless directed otherwise by surgical center/hospital staff. 7. No alcoholic beverages 24-hours prior to surgery.  No smoking 24-hours prior or 24-hours after  surgery. 8. Wear loose pants or shorts. They should be loose enough to fit over bandages, boots, and casts. 9. Don't wear slip-on shoes. Sneakers are preferred. 10. Bring your boot with you to the surgery center/hospital.  Also bring crutches or a walker if your physician has prescribed it for you.  If you do not have this equipment, it will be provided for you after surgery. 11. If you have not been contacted by the surgery center/hospital by the day before your surgery, call to confirm the date and time of your surgery. 12. Leave-time from work may vary depending on the type of surgery you have.  Appropriate arrangements should be made prior to surgery with your employer. 13. Prescriptions will be provided immediately following surgery by your doctor.  Fill these as soon as possible after surgery and take the medication as directed. Pain medications will not be refilled on weekends and must be approved by the doctor. 14. Remove nail polish on the operative foot and avoid getting pedicures prior to surgery. 15. Wash the night before surgery.  The night before surgery wash the foot and leg well with water and the antibacterial soap provided. Be sure to pay special attention to beneath the toenails and in between the toes.  Wash for at least three (3) minutes. Rinse thoroughly with water and dry well with a towel.  Perform this wash unless told not to do so by your physician.  Enclosed: 1 Ice pack (please put in freezer the night before surgery)   1 Hibiclens skin cleaner     Pre-op instructions  If you have any questions regarding the instructions, please do not hesitate to call our office.  [place]: [address], , [place] [address] -- [phone]  [place]: [address] [place], [place] [address] -- [phone]  [place]: [address]  [place], [place] [address] -- [phone]  [place]: [address], [place], [place] [address] -- [phone]  Website:  [email or web address] 

## 2019-03-16 NOTE — Progress Notes (Signed)
Subjective:   Patient ID: Danielle Gould, female   DOB: 48 y.o.   MRN: [id number]   HPI Patient states the left heel is absolutely killing her and no matter what she does is related to her problem or if she has to have something done as she is simply not able to function with the pain she is in   ROS      Objective:  Physical Exam  Neurovascular status intact negative Homans sign noted with patient's left plantar heel being severely tender in the medial band at the insertion tendon calcaneus with failure to respond to numerous conservative treatments at this time     Assessment:  Severe acute plantar fasciitis of approximate 6 months duration which is not improving     Plan:  H&P condition reviewed and recommended endoscopic surgery reviewing the surgery with the patient.  Patient understands no guarantee as far as success understands all complications outlined the consent form and she is willing to accept the risk of procedure after extensive review.  She understands that she will be in a boot for approximately 3 to 4 weeks postoperatively and the total recovery will take 6 months to 1 year with no long-term guarantees and possibility for arch pain or lateral foot pain which can occur with this particular procedure

## 2019-04-13 ENCOUNTER — Telehealth: Payer: Self-pay | Admitting: *Deleted

## 2019-04-13 NOTE — Telephone Encounter (Signed)
I left her a message that someone from the surgical center would give her a call on either the Friday or the Monday prior to her surgery date.

## 2019-04-13 NOTE — Telephone Encounter (Signed)
"  I have surgery with Dr. Paulla Dolly on August 25.  This may sound crazy.  I know I set it up but I don't know what time my surgery is.  Can you call me back?  I went on my [facility] but it doesn't state the time of the surgery."

## 2019-04-18 ENCOUNTER — Telehealth: Payer: Self-pay | Admitting: *Deleted

## 2019-04-18 NOTE — Telephone Encounter (Signed)
DOS 04/19/2019 ENDOSCOPIC PLANTAR FASCIOTOMY LEFT FOOT  Effective Date - 08/25/2018  Per Danae Chen Benefits Call Ref. # [id number]  Deductible - $750 met Co-insurance - 80% Out of Pocket - $2500/  [id number]   Per Reuel Derby pre-certification is NOT REQUIRED. Call Ref. # [id number]

## 2019-04-19 ENCOUNTER — Encounter: Payer: Self-pay | Admitting: Podiatry

## 2019-04-19 DIAGNOSIS — M722 Plantar fascial fibromatosis: Secondary | ICD-10-CM | POA: Diagnosis not present

## 2019-04-22 ENCOUNTER — Telehealth: Payer: Self-pay

## 2019-04-22 NOTE — Telephone Encounter (Signed)
Left voicemail in regards to patient's postoperative status advising her if she had any questions or concerns to call the office.

## 2019-04-22 NOTE — Progress Notes (Signed)
DOS 04/19/2019 EPF left heel

## 2019-04-27 ENCOUNTER — Ambulatory Visit (INDEPENDENT_AMBULATORY_CARE_PROVIDER_SITE_OTHER): Payer: 59 | Admitting: Podiatry

## 2019-04-27 ENCOUNTER — Other Ambulatory Visit: Payer: Self-pay

## 2019-04-27 ENCOUNTER — Encounter: Payer: Self-pay | Admitting: Podiatry

## 2019-04-27 VITALS — Temp 98.7°F

## 2019-04-27 DIAGNOSIS — M722 Plantar fascial fibromatosis: Secondary | ICD-10-CM | POA: Diagnosis not present

## 2019-04-27 DIAGNOSIS — Z09 Encounter for follow-up examination after completed treatment for conditions other than malignant neoplasm: Secondary | ICD-10-CM

## 2019-04-27 NOTE — Progress Notes (Signed)
Subjective:   Patient ID: Danielle Gould, female   DOB: 48 y.o.   MRN: [id number]   HPI Patient states doing real well with surgery with minimal discomfort   ROS      Objective:  Physical Exam  Neurovascular status intact negative Homans sign noted wound edges well healed medial lateral side heel with minimal heel discomfort mild arch pain left     Assessment:  Doing well post fascial release left heel     Plan:  Reapplied sterile dressing advised on continued compression elevation immobilization reappoint 2 weeks suture removal or earlier if needed

## 2019-05-11 ENCOUNTER — Encounter: Payer: Self-pay | Admitting: Podiatry

## 2019-05-11 ENCOUNTER — Ambulatory Visit (INDEPENDENT_AMBULATORY_CARE_PROVIDER_SITE_OTHER): Payer: 59 | Admitting: Podiatry

## 2019-05-11 ENCOUNTER — Other Ambulatory Visit: Payer: Self-pay

## 2019-05-11 DIAGNOSIS — M722 Plantar fascial fibromatosis: Secondary | ICD-10-CM

## 2019-05-11 DIAGNOSIS — Z09 Encounter for follow-up examination after completed treatment for conditions other than malignant neoplasm: Secondary | ICD-10-CM

## 2019-05-17 NOTE — Progress Notes (Signed)
Subjective:   Patient ID: Danielle Gould, female   DOB: 48 y.o.   MRN: [id number]   HPI Patient states feeling a lot better with minimal current discomfort   ROS      Objective:  Physical Exam  Neurovascular status intact with patient's left foot doing much better without the boot     Assessment:  Doing well post endoscopic surgery left heel     Plan:  H&P reviewed advised on continued boot usage with gradual increase in activities over the next 4 weeks and continued compression at this time with elevation as needed stitches removed wound edges healed well with bandages applied signed visit

## 2019-07-25 ENCOUNTER — Telehealth: Payer: Self-pay | Admitting: *Deleted

## 2019-07-25 NOTE — Telephone Encounter (Signed)
Pt states she had foot surgery with Dr. Paulla Dolly and everything had been fine until a week ago, now the foot is painful and swelling.

## 2019-07-25 NOTE — Telephone Encounter (Signed)
I spoke with pt, and informed that since she had been doing good and now had pain, swelling she should make an appt to be evaluated again, and to go back in to the boot, rest, ice 3-4 times daily for 15-20 minutes/session protecting the skin from the ice with a light cloth and I transferred to schedulers. Pt states she is already in the boot and compression sock, but when she comes out of them her foot is painful and swells.

## 2019-08-05 ENCOUNTER — Other Ambulatory Visit: Payer: Self-pay

## 2019-08-05 ENCOUNTER — Encounter: Payer: Self-pay | Admitting: Podiatry

## 2019-08-05 ENCOUNTER — Ambulatory Visit (INDEPENDENT_AMBULATORY_CARE_PROVIDER_SITE_OTHER): Payer: 59 | Admitting: Podiatry

## 2019-08-05 VITALS — HR 97

## 2019-08-05 DIAGNOSIS — M7672 Peroneal tendinitis, left leg: Secondary | ICD-10-CM | POA: Diagnosis not present

## 2019-08-05 DIAGNOSIS — L600 Ingrowing nail: Secondary | ICD-10-CM | POA: Diagnosis not present

## 2019-08-08 NOTE — Progress Notes (Signed)
Subjective:   Patient ID: Danielle Gould, female   DOB: 49 y.o.   MRN: [id number]   HPI Patient has developed pain on the lateral side of the left foot with inflammation and also has traumatized the right hallux nail with discoloration and mild discomfort.  Has not noted drainage or swelling around the right   ROS      Objective:  Physical Exam  Neurovascular status intact with healing site left heel that is done well with no medial discomfort the patient's been very active and may have inflamed the peroneal tendon with inflammation at its insertion base of fifth metatarsal.  Right hallux nail is discolored and moderately painful     Assessment:  Peroneal tendinitis left and on right traumatized hallux nail with discomfort     Plan:  H&P reviewed both conditions and for the left I did sterile prep and injected the tendon complex 3 mg Kenalog 5 mg Xylocaine for the right I recommended soaks and I did discuss possibility for nail removal in future  X-rays left were negative for signs of fracture or arthritic condition

## 2021-06-01 ENCOUNTER — Emergency Department (HOSPITAL_COMMUNITY)
Admission: EM | Admit: 2021-06-01 | Discharge: 2021-06-01 | Disposition: A | Discharge status: Home / Self Care | Payer: 59 | Attending: Emergency Medicine | Admitting: Emergency Medicine

## 2021-06-01 ENCOUNTER — Other Ambulatory Visit: Payer: Self-pay

## 2021-06-01 ENCOUNTER — Encounter (HOSPITAL_COMMUNITY): Payer: Self-pay | Admitting: Emergency Medicine

## 2021-06-01 ENCOUNTER — Emergency Department (HOSPITAL_COMMUNITY): Payer: 59

## 2021-06-01 DIAGNOSIS — R0602 Shortness of breath: Secondary | ICD-10-CM | POA: Insufficient documentation

## 2021-06-01 DIAGNOSIS — Z5321 Procedure and treatment not carried out due to patient leaving prior to being seen by health care provider: Secondary | ICD-10-CM | POA: Diagnosis not present

## 2021-06-01 DIAGNOSIS — R110 Nausea: Secondary | ICD-10-CM | POA: Insufficient documentation

## 2021-06-01 DIAGNOSIS — R0789 Other chest pain: Secondary | ICD-10-CM | POA: Diagnosis not present

## 2021-06-01 LAB — CBC
HCT: 43.1 % (ref 36.0–46.0)
Hemoglobin: 13.6 g/dL (ref 12.0–15.0)
MCH: 28.2 pg (ref 26.0–34.0)
MCHC: 31.6 g/dL (ref 30.0–36.0)
MCV: 89.2 fL (ref 80.0–100.0)
Platelets: 252 10*3/uL (ref 150–400)
RBC: 4.83 MIL/uL (ref 3.87–5.11)
RDW: 13.2 % (ref 11.5–15.5)
WBC: 6.3 10*3/uL (ref 4.0–10.5)
nRBC: 0 % (ref 0.0–0.2)

## 2021-06-01 LAB — BASIC METABOLIC PANEL
Anion gap: 7 (ref 5–15)
BUN: 10 mg/dL (ref 6–20)
CO2: 26 mmol/L (ref 22–32)
Calcium: 9.1 mg/dL (ref 8.9–10.3)
Chloride: 105 mmol/L (ref 98–111)
Creatinine, Ser: 0.72 mg/dL (ref 0.44–1.00)
GFR, Estimated: >60 mL/min (ref >60)
Glucose, Bld: 99 mg/dL (ref 70–99)
Potassium: 4.3 mmol/L (ref 3.5–5.1)
Sodium: 138 mmol/L (ref 135–145)

## 2021-06-01 LAB — TROPONIN I (HIGH SENSITIVITY): Troponin I (High Sensitivity): 3 ng/L (ref <18)

## 2021-06-01 MED ORDER — ASPIRIN 81 MG PO CHEW
324.0000 mg | CHEWABLE_TABLET | Freq: Once | ORAL | Status: AC
  Administered 2021-06-01: 324 mg via ORAL
  Filled 2021-06-01: qty 4

## 2021-06-01 NOTE — ED Provider Notes (Addendum)
Emergency Medicine Provider Triage Evaluation Note  Danielle Gould , a 50 y.o. female  was evaluated in triage.  Pt complains of midsternal CP, onset 3 days ago, intermittent until today when pain became constant at 6:45am, severity waxes and wanes, described as  a baby elephant sitting on her chest. Denies associated diaphoresis. No pain with exertion. No lower extremity edema. Reports HTN, taking meds as prescribed, no missed doses.Reports increase in stress at home, husband with prostate CA with mets. Recent CP admission with normal stress, dc with increase in BP meds.  Review of Systems  Positive: CP, SHOB, nausea Negative: diaphoresis   Physical Exam  There were no vitals taken for this visit. Gen:   Awake, no distress   Resp:  Normal effort  MSK:   Moves extremities without difficulty  Other:    Medical Decision Making  Medically screening exam initiated at 10:06 AM.  Appropriate orders placed.  Danielle Gould was informed that the remainder of the evaluation will be completed by another provider, this initial triage assessment does not replace that evaluation, and the importance of remaining in the ED until their evaluation is complete.     Jeannie Fend, PA-C 06/01/21 1009    Jeannie Fend, PA-C 06/01/21 1010    Jeannie Fend, PA-C 06/01/21 1014    Gerhard Munch, MD 06/01/21 [phone]

## 2021-06-01 NOTE — ED Triage Notes (Signed)
C/o intermittent chest pain x 3 days.  Pain worse since 6:45am with SOB and nausea.

## 2021-06-01 NOTE — ED Notes (Signed)
Pt states she is better and is going to leave.  Encouraged her to stay for repeat cardiac markers and she declined.

## 2021-06-19 DIAGNOSIS — R072 Precordial pain: Secondary | ICD-10-CM | POA: Insufficient documentation

## 2021-06-19 NOTE — Progress Notes (Signed)
Patient referred by Marda Stalker, PA-C for chest pain, hypertension  Subjective:   Danielle Gould, female    DOB: 04-01-1971, 50 y.o.   MRN: [id number]   Chief Complaint  Patient presents with   Chest Pain   Hypertension   New Patient (Initial Visit)     HPI  50 y.o. 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  She does not do any regular physical activity or exercise outside of it.  She has been hypertensive for last couple of years.  She was originally on metoprolol 50 mg daily, increased to 100 mg daily, and also added on valsartan 160 mg daily.  Blood pressure remains elevated.  She has had episodes of retrosternal chest tightness, which is present with both rest and exertion, lasting for couple minutes.  She underwent exercise nuclear stress testing in 11/2020 that showed reduced exercise tolerance, but no evidence of ischemia on EKG or perfusion imaging.  Patient reports a lot of stress related to work.  She is also been suffering with interstitial cystitis.  She also has stress at home related to her husband's illness.  Patient reports snoring at night, has never undergone sleep study.  Patient is a former 20-pack-year smoker, now quit.  She does have family history of CAD in 63s and 14s.   Past Medical History:  Diagnosis Date   Chronic constipation    Chronic diarrhea    Difficult intubation    Gallstones    H/O transurethral destruction of bladder lesion    [facility]   Hemorrhoids    History of migraine    last one a wk ago and pt states menstrual related   Hypertension    takes Metoprolol daily   Insomnia    takes Restoril prn    Lower extremity weakness    Numbness    Urinary frequency      Past Surgical History:  Procedure Laterality Date   CERVICAL CONIZATION W/BX N/A 12/07/2012   Procedure: CONIZATION CERVIX WITH  BIOPSY;  Surgeon: Thurnell Lose, MD;  Location: [facility];  Service: Gynecology;  Laterality: N/A;   CHOLECYSTECTOMY  04/01/2012   Procedure: LAPAROSCOPIC CHOLECYSTECTOMY WITH INTRAOPERATIVE CHOLANGIOGRAM;  Surgeon: Gayland Curry, MD,FACS;  Location: [facility];  Service: General;  Laterality: N/A;   COLONOSCOPY     COLPOSCOPY N/A 12/07/2012   Procedure: COLPOSCOPY;  Surgeon: Thurnell Lose, MD;  Location: [facility];  Service: Gynecology;  Laterality: N/A;   CYSTO WITH HYDRODISTENSION N/A 04/27/2018   Procedure: CYSTOSCOPY/HYDRODISTENSION AND INSTILLATION/EXAMINATION UNDER ANESTHESIA;  Surgeon: Bjorn Loser, MD;  Location: [facility];  Service: Urology;  Laterality: N/A;   ESOPHAGOGASTRODUODENOSCOPY     EXCISION OF MESH N/A 09/08/2017   Procedure: REMOVAL OF EXPOSED VAGINAL MESH;  Surgeon: Bjorn Loser, MD;  Location: [facility];  Service: Urology;  Laterality: N/A;   INCONTINENCE SURGERY  7-47yrs ago   uterus collapsed  7-42yrs ago     Social History   Tobacco Use  Smoking Status Every Day   Packs/day: 0.50   Years: 26.00   Pack years: 13.00   Types: Cigarettes  Smokeless Tobacco Never    Social History   Substance and Sexual Activity  Alcohol Use Yes   Comment: socially     Family History  Problem Relation Age of Onset   Breast cancer Mother    Diverticulitis Mother  Other Father        unsure of history   Breast cancer Maternal Grandmother    Ovarian cancer Maternal Grandmother    Aneurysm Maternal Grandfather      Current Outpatient Medications on File Prior to Visit  Medication Sig Dispense Refill   acetaminophen (TYLENOL) 500 MG tablet Take 2,000 mg by mouth every 6 (six) hours as needed (for pain.).     albuterol (VENTOLIN HFA) 108 (90 Base) MCG/ACT inhaler Inhale 2 puffs into the lungs every 6 (six) hours as needed.     amitriptyline (ELAVIL) 25 MG tablet Take 25 mg by mouth at bedtime.     diazepam (VALIUM) 10 MG tablet SMARTSIG:1 Tablet(s) Vaginal Every 8 Hours PRN      diclofenac (VOLTAREN) 75 MG EC tablet Take 1 tablet (75 mg total) by mouth 2 (two) times daily. (Patient not taking: Reported on 08/05/2019) 50 tablet 2   gabapentin (NEURONTIN) 300 MG capsule Take 1 cap QHS.  Please call [phone] to schedule appt. 90 capsule 1   gabapentin (NEURONTIN) 300 MG capsule Take by mouth.     ibuprofen (ADVIL,MOTRIN) 200 MG tablet Take 800 mg by mouth every 6 (six) hours as needed (for pain.).     metoprolol succinate (TOPROL-XL) 50 MG 24 hr tablet Take 50 mg by mouth daily.  1   pentosan polysulfate (ELMIRON) 100 MG capsule Take 200 mg by mouth 2 (two) times daily.     No current facility-administered medications on file prior to visit.    Cardiovascular and other pertinent studies:  EKG 06/20/2021: Sinus rhythm 80 bpm Normal EKG  Nuclear stress test 12/21/2020: Reduced exercise tolerance (6.7 METS)  No angina or ST segment changes provoked  No arrhythmias  No reversible ischemia or infarction.  Normal left ventricular wall motion.  Left ventricular ejection fraction 74%  Non invasive risk stratification: Low     Recent labs: 06/01/2021: Glucose 99, BUN/Cr 10/0.7. EGFR 77. Na/K 138/4.3. Rest of the CMP normal H/H 13/43. MCV 99. Platelets 252 Trop HS 3    Review of Systems  Cardiovascular:  Positive for chest pain. Negative for dyspnea on exertion, leg swelling, palpitations and syncope.        Vitals:   06/20/21 0847 06/20/21 0855  BP: (!) 149/95 (!) 159/93  Pulse:  82  Resp: 16   Temp: 97.8 F (36.6 C)   SpO2: 99%      Body mass index is 38.44 kg/m. Filed Weights   06/20/21 0847  Weight: 231 lb (104.8 kg)     Objective:   Physical Exam Vitals and nursing note reviewed.  Constitutional:      General: She is not in acute distress. Neck:     Vascular: No JVD.  Cardiovascular:     Rate and Rhythm: Normal rate and regular rhythm.     Heart sounds: Normal heart sounds. No murmur heard. Pulmonary:     Effort: Pulmonary  effort is normal.     Breath sounds: Normal breath sounds. No wheezing or rales.  Musculoskeletal:     Right lower leg: No edema.     Left lower leg: No edema.           Assessment & Recommendations:   50 y.o. Caucasian female , former smoker with hypertension, chest pain  Chest pain: Atypical, normal exercise stress testing in 11/2020. I suspect this may be related to her uncontrolled hypertension. Continue metoprolol succinate 100 mg daily, valsartan 160 mg daily.  Added amlodipine  5 mg daily. Check echocardiogram, renal artery duplex, renin/aldosterone. Given her snoring and large neck circumference, recommend sleep study. Will also check CT cardiac scoring for risk stratification.  Further recommendations after above testing.  Thank you for referring the patient to [place]. Please feel free to contact with any questions.   Nigel Mormon, MD Pager: [phone] Office: [phone]

## 2021-06-20 ENCOUNTER — Encounter: Payer: Self-pay | Admitting: Cardiology

## 2021-06-20 ENCOUNTER — Ambulatory Visit: Payer: 59 | Admitting: Cardiology

## 2021-06-20 ENCOUNTER — Other Ambulatory Visit: Payer: Self-pay

## 2021-06-20 VITALS — BP 159/93 | HR 82 | Temp 97.8°F | Resp 16 | Ht 65.0 in | Wt 231.0 lb

## 2021-06-20 DIAGNOSIS — I10 Essential (primary) hypertension: Secondary | ICD-10-CM

## 2021-06-20 DIAGNOSIS — R0683 Snoring: Secondary | ICD-10-CM | POA: Insufficient documentation

## 2021-06-20 DIAGNOSIS — R072 Precordial pain: Secondary | ICD-10-CM

## 2021-06-20 MED ORDER — AMLODIPINE BESYLATE 5 MG PO TABS: 5.0000 mg | ORAL_TABLET | Freq: Every day | ORAL | 3 refills | Status: DC

## 2021-06-28 ENCOUNTER — Other Ambulatory Visit: Payer: 59

## 2021-07-04 ENCOUNTER — Other Ambulatory Visit: Payer: Self-pay

## 2021-07-04 ENCOUNTER — Ambulatory Visit: Payer: 59

## 2021-07-04 DIAGNOSIS — I10 Essential (primary) hypertension: Secondary | ICD-10-CM

## 2021-07-04 DIAGNOSIS — R072 Precordial pain: Secondary | ICD-10-CM

## 2021-07-09 ENCOUNTER — Ambulatory Visit
Admission: RE | Admit: 2021-07-09 | Discharge: 2021-07-09 | Disposition: A | Discharge status: Home / Self Care | Payer: No Typology Code available for payment source | Source: Ambulatory Visit | Attending: Cardiology | Admitting: Cardiology

## 2021-07-09 DIAGNOSIS — R072 Precordial pain: Secondary | ICD-10-CM

## 2021-07-24 NOTE — Progress Notes (Signed)
Patient referred by Marda Stalker, PA-C for chest pain, hypertension  Subjective:   Danielle Gould, female    DOB: 10/02/1970, 50 y.o.   MRN: [id number]   Chief Complaint  Patient presents with   Chest Pain   Follow-up    4-6 week   Hypertension   Results    Echo and calcium score     HPI  50 y.o. Caucasian female , former smoker with hypertension, chest pain  Blood pressure is much improved after addition of amlodipine.  Discussed recent test results with the patient, details below.  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  She does not do any regular physical activity or exercise outside of it.  She has been hypertensive for last couple of years.  She was originally on metoprolol 50 mg daily, increased to 100 mg daily, and also added on valsartan 160 mg daily.  Blood pressure remains elevated.  She has had episodes of retrosternal chest tightness, which is present with both rest and exertion, lasting for couple minutes.  She underwent exercise nuclear stress testing in 11/2020 that showed reduced exercise tolerance, but no evidence of ischemia on EKG or perfusion imaging.  Patient reports a lot of stress related to work.  She is also been suffering with interstitial cystitis.  She also has stress at home related to her husband's illness.  Patient reports snoring at night, has never undergone sleep study.  Patient is a former 20-pack-year smoker, now quit.  She does have family history of CAD in 31s and 45s.   Current Outpatient Medications on File Prior to Visit  Medication Sig Dispense Refill   albuterol (VENTOLIN HFA) 108 (90 Base) MCG/ACT inhaler Inhale 2 puffs into the lungs every 6 (six) hours as needed.     amLODipine (NORVASC) 5 MG tablet Take 1 tablet (5 mg total) by mouth daily. 180 tablet 3   diazepam (VALIUM) 10 MG tablet SMARTSIG:1 Tablet(s) Vaginal Every 8 Hours PRN     famotidine  (PEPCID) 40 MG tablet Take 40 mg by mouth daily.     ibuprofen (ADVIL,MOTRIN) 200 MG tablet Take 800 mg by mouth every 6 (six) hours as needed (for pain.).     metoprolol succinate (TOPROL-XL) 50 MG 24 hr tablet Take 50 mg by mouth daily.  1   pentosan polysulfate (ELMIRON) 100 MG capsule Take 200 mg by mouth 2 (two) times daily.     valsartan (DIOVAN) 80 MG tablet Take 160 mg by mouth daily.     No current facility-administered medications on file prior to visit.    Cardiovascular and other pertinent studies:  Calcium score: 0  Echocardiogram 07/04/2021:  Normal LV systolic function with visual EF 68%. Mild concentric  hypertrophy of the left ventricle. Normal global wall motion. Left  ventricle cavity is normal in size. Doppler evidence of grade I (impaired)  diastolic dysfunction, normal LAP.  Left atrial cavity is mildly dilated.  Mild (Grade I) mitral regurgitation.  No evidence of pulmonary hypertension.  Renal artery duplex  07/04/2021:  No evidence of renal artery occlusive disease in either renal artery.  Normal intrarenal vascular perfusion is noted in both kidneys.  Renal length is within normal limits for both kidneys.  No plaque observed in the abdominal aorta. Normal abdominal aorta flow  velocities noted.  EKG 06/20/2021: Sinus rhythm 80 bpm Normal EKG  Nuclear stress test 12/21/2020: Reduced exercise tolerance (6.7  METS)  No angina or ST segment changes provoked  No arrhythmias  No reversible ischemia or infarction.  Normal left ventricular wall motion.  Left ventricular ejection fraction 74%  Non invasive risk stratification: Low     Recent labs: 06/01/2021: Glucose 99, BUN/Cr 10/0.7. EGFR 77. Na/K 138/4.3. Rest of the CMP normal H/H 13/43. MCV 99. Platelets 252 Trop HS 3    Review of Systems  Cardiovascular:  Negative for chest pain, dyspnea on exertion, leg swelling, palpitations and syncope.        Vitals:   07/25/21 0833  BP: 129/70   Pulse: 87  Resp: 17  Temp: 98 F (36.7 C)  SpO2: 99%     Body mass index is 40.44 kg/m. Filed Weights   07/25/21 0833  Weight: 243 lb (110.2 kg)     Objective:   Physical Exam Vitals and nursing note reviewed.  Constitutional:      General: She is not in acute distress. Neck:     Vascular: No JVD.  Cardiovascular:     Rate and Rhythm: Normal rate and regular rhythm.     Heart sounds: Normal heart sounds. No murmur heard. Pulmonary:     Effort: Pulmonary effort is normal.     Breath sounds: Normal breath sounds. No wheezing or rales.  Musculoskeletal:     Right lower leg: No edema.     Left lower leg: No edema.           Assessment & Recommendations:   50 y.o. Caucasian female , former smoker with hypertension, chest pain  Chest pain: Likely atypical, now resolved.  Calcium score 0.  Previously normal stress test in 11/2018.  Structurally normal heart.  Hypertension: Not well controlled on amlodipine.  In future, she could work with the PCP to wean down on some of these other medications, especially if she has sleep apnea which can be treated.  I will see her on as-needed basis.   Nigel Mormon, MD Pager: [phone] Office: [phone]

## 2021-07-25 ENCOUNTER — Ambulatory Visit: Payer: 59 | Admitting: Cardiology

## 2021-07-25 ENCOUNTER — Other Ambulatory Visit: Payer: Self-pay

## 2021-07-25 ENCOUNTER — Encounter: Payer: Self-pay | Admitting: Cardiology

## 2021-07-25 VITALS — BP 129/70 | HR 87 | Temp 98.0°F | Resp 17 | Ht 65.0 in | Wt 243.0 lb

## 2021-07-25 DIAGNOSIS — I10 Essential (primary) hypertension: Secondary | ICD-10-CM

## 2021-07-25 DIAGNOSIS — R072 Precordial pain: Secondary | ICD-10-CM

## 2021-07-25 MED ORDER — AMLODIPINE BESYLATE 5 MG PO TABS: 5.0000 mg | ORAL_TABLET | Freq: Every day | ORAL | 1 refills | Status: DC

## 2022-07-11 ENCOUNTER — Other Ambulatory Visit: Payer: Self-pay | Admitting: Cardiology

## 2022-07-11 DIAGNOSIS — I10 Essential (primary) hypertension: Secondary | ICD-10-CM

## 2022-10-13 ENCOUNTER — Other Ambulatory Visit: Payer: Self-pay | Admitting: Cardiology

## 2022-10-13 DIAGNOSIS — I10 Essential (primary) hypertension: Secondary | ICD-10-CM
